# Patient Record
Sex: Female | Born: 1970 | Race: Black or African American | Marital: Single | Smoking: Current every day smoker
Health system: Southern US, Community
[De-identification: ages and names within clinical notes are randomized; demographics above are authoritative.]

## PROBLEM LIST (undated history)

## (undated) DIAGNOSIS — I10 Essential (primary) hypertension: Secondary | ICD-10-CM

## (undated) DIAGNOSIS — E785 Hyperlipidemia, unspecified: Secondary | ICD-10-CM

## (undated) DIAGNOSIS — E119 Type 2 diabetes mellitus without complications: Secondary | ICD-10-CM

## (undated) DIAGNOSIS — Z8619 Personal history of other infectious and parasitic diseases: Secondary | ICD-10-CM

## (undated) HISTORY — DX: Personal history of other infectious and parasitic diseases: Z86.19

## (undated) HISTORY — DX: Type 2 diabetes mellitus without complications: E11.9

## (undated) HISTORY — DX: Hyperlipidemia, unspecified: E78.5

## (undated) HISTORY — DX: Essential (primary) hypertension: I10

## (undated) HISTORY — PX: TUBAL LIGATION: SHX77

---

## 2017-03-22 ENCOUNTER — Ambulatory Visit: Payer: 59 | Attending: Nurse Practitioner | Admitting: Physical Therapy

## 2017-03-22 ENCOUNTER — Encounter: Payer: Self-pay | Admitting: Physical Therapy

## 2017-03-22 ENCOUNTER — Other Ambulatory Visit: Payer: Self-pay

## 2017-03-22 DIAGNOSIS — M79604 Pain in right leg: Secondary | ICD-10-CM | POA: Insufficient documentation

## 2017-03-22 DIAGNOSIS — R262 Difficulty in walking, not elsewhere classified: Secondary | ICD-10-CM | POA: Diagnosis present

## 2017-03-22 DIAGNOSIS — M25551 Pain in right hip: Secondary | ICD-10-CM | POA: Insufficient documentation

## 2017-03-22 NOTE — Therapy (Signed)
[place], [place], [address] Phone: [phone]   Fax:  [phone]  Physical Therapy Evaluation  Patient Details  Name: Lisa Maynard MRN: [id number] Date of Birth: [birth year] Referring Provider: Anselm Jungling, FNP   Encounter Date: 03/22/2017  PT End of Session - 03/22/17 1553    Visit Number  1    Number of Visits  13    Date for PT Re-Evaluation  05/06/17    Authorization Type  AETNA    PT Start Time  1546    PT Stop Time  1638    PT Time Calculation (min)  52 min    Activity Tolerance  Patient tolerated treatment well    Behavior During Therapy  [place] for tasks assessed/performed       History reviewed. No pertinent past medical history.  History reviewed. No pertinent surgical history.  There were no vitals filed for this visit.   Subjective Assessment - 03/22/17 1553    Subjective  No pain in back unless you touch in, constant pain in lateral thigh that travels to lateral knee on Rt. Pain began June 2018 and progressively worsened. Was in PT to try to strengthen hip but feels like it got worse. Denies help from meloxicam- has been taking it about 1 month. Was standing at elevator yesterday and Lt knee gave out.     How long can you sit comfortably?  5 min    How long can you stand comfortably?  30 min    How long can you walk comfortably?  standing still is worse    Patient Stated Goals  sleep, reduce pain    Currently in Pain?  Yes    Pain Score  6  10/10 at worst in last 48 hr    Pain Location  Hip    Pain Orientation  Right;Lateral    Pain Descriptors / Indicators  Stabbing;Aching stabbing is random    Pain Relieving Factors  TENS         [facility] PT Assessment - 03/22/17 0001      Assessment   Medical Diagnosis  Rt LBP with sciatica    Referring Provider  Anselm Jungling, FNP    Hand Dominance  Right    Next MD Visit  2/28    Prior Therapy  yes 2 weeks      Precautions   Precautions  None      Restrictions   Weight Bearing Restrictions  No      Balance Screen   Has the patient fallen in the past 6 months  No      Home Environment   Additional Comments  stairs at work      Prior Automotive engineer      Cognition   Overall Cognitive Status  Within Functional Limits for tasks assessed      Observation/Other Assessments   Focus on Therapeutic Outcomes (FOTO)   54% limited      Sensation   Additional Comments  N/T in bilat feet, intermittent, edema       Posture/Postural Control   Posture Comments  standing with trunk sidebend Lt      ROM / Strength   AROM / PROM / Strength  Strength      Strength   Overall Strength Comments  not appropriate to test at eval due to pelvic rotation      Palpation   Palpation comment  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      [facility] Adult PT Treatment/Exercise - 03/22/17 0001      Exercises   Exercises  Knee/Hip      Knee/Hip Exercises: Supine   Other Supine Knee/Hip Exercises  pillow squeeze    Other Supine Knee/Hip Exercises  posterior pelvic tilt- practiced in supine, seated and standing      Modalities   Modalities  Electrical Stimulation;Moist Heat      Moist Heat Therapy   Number Minutes Moist Heat  15 Minutes with TENS    Moist Heat Location  Hip      Electrical Stimulation   Electrical Stimulation Location  Rt hip    Electrical Stimulation Action  IFC     Electrical Stimulation Parameters  15 min with heat    Electrical Stimulation Goals  Pain      Manual Therapy   Manual Therapy  Muscle Energy Technique    Muscle Energy Technique  Rt hip flexors/Lt extensors, iso add, manual LTR             PT Education - 03/22/17 1655    Education provided  Yes    Education Details  anatomy of condition, POC, HEP, exercise form/rationale, FOTO    Person(s) Educated  Patient     Methods  Explanation;Demonstration;Tactile cues;Verbal cues    Comprehension  Verbalized understanding;Need further instruction;Returned demonstration;Verbal cues required;Tactile cues required          PT Long Term Goals - 03/22/17 1643      PT LONG TERM GOAL #1   Title  Pt will be able to ambulate throughout her day for work and ADLs hip pain <=2/10    Baseline  up to 10/10 at eval    Time  6    Period  Weeks    Status  New    Target Date  05/06/17      PT LONG TERM GOAL #2   Title  Pt will be able to sit on the floor and get up/down from the floor without increased hip pain    Baseline  severe at eval    Time  6    Period  Weeks    Status  New    Target Date  05/06/17      PT LONG TERM GOAL #3   Title  Pt will be able to sit for at least 30 min without increased hip pain    Baseline  has to move after about 5 min    Time  6    Period  Weeks    Status  New    Target Date  05/06/17      PT LONG TERM GOAL #4   Title  FOTO to 40% limited to indicate significant improvement in functional ability    Baseline  54% limitation at eval    Time  6    Period  Weeks    Status  New    Target Date  05/06/17             Plan - 03/22/17 1629    Clinical Impression Statement  Pt presents to PT with complaints of Rt hip and lateral thigh pain that began in June. 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 Notable Rt post pelvic rotation creating LLD (Rt shorter) and abnormal strain along biomechanical chain. MET  utilized today to correct alignment which pt reported decreased pain but could feel posterior soreness as expected. Pt will benefit from skilled PT in order to improve lumbopelvic alignment and stability to reach long term functional goals.     History and Personal Factors relevant to plan of care:  chronic back pain    Clinical Presentation  Stable    Clinical Decision Making  Low    Rehab Potential  Good    PT Frequency  2x / week    PT Duration  6 weeks    PT  Treatment/Interventions  ADLs/Self Care Home Management;Cryotherapy;Electrical Stimulation;Ultrasound;Traction;Moist Heat;Iontophoresis 82m/ml Dexamethasone;Gait training;Stair training;Functional mobility training;Therapeutic activities;Therapeutic exercise;Balance training;Patient/family education;Neuromuscular re-education;Manual techniques;Scar mobilization;Passive range of motion;Taping;Dry needling    PT Next Visit Plan  recheck pelvic alignment, manual to soft tissues, lumbopelvic stability, ultrasound to bursa PRN    PT Home Exercise Plan  hooklying ball squeeze, abdominal engagement, stand equally on feet    Consulted and Agree with Plan of Care  Patient       Patient will benefit from skilled therapeutic intervention in order to improve the following deficits and impairments:  Pain, Improper body mechanics, Increased muscle spasms, Decreased activity tolerance, Decreased strength, Difficulty walking  Visit Diagnosis: Pain in right hip - Plan: PT plan of care cert/re-cert  Pain in right leg - Plan: PT plan of care cert/re-cert  Difficulty in walking, not elsewhere classified - Plan: PT plan of care cert/re-cert     Problem List There are no active problems to display for this patient.  Keilynn Marano C. Cris Gibby PT, DPT 03/22/17 5:10 PM   [facility] Center128 [address] StreetGFoot of [place] [address]Phone: [phone]  Fax:  [phone] Name: SZlata AlcaideMRN: [id number] of Birth: 712-04-1970

## 2017-03-24 ENCOUNTER — Ambulatory Visit: Payer: 59 | Admitting: Physical Therapy

## 2017-03-24 ENCOUNTER — Encounter: Payer: Self-pay | Admitting: Physical Therapy

## 2017-03-24 DIAGNOSIS — M79604 Pain in right leg: Secondary | ICD-10-CM

## 2017-03-24 DIAGNOSIS — R262 Difficulty in walking, not elsewhere classified: Secondary | ICD-10-CM

## 2017-03-24 DIAGNOSIS — M25551 Pain in right hip: Secondary | ICD-10-CM

## 2017-03-24 NOTE — Therapy (Signed)
[place], [place], [address] Phone: [phone]   Fax:  [phone]  Physical Therapy Treatment  Patient Details  Name: Lisa Maynard MRN: [id number] Date of Birth: 09-10-1970 Referring Provider: Anselm Jungling, FNP   Encounter Date: 03/24/2017  PT End of Session - 03/24/17 1306    Visit Number  2    Number of Visits  13    Date for PT Re-Evaluation  05/06/17    Authorization Type  AETNA    PT Start Time  0104    PT Stop Time  0200    PT Time Calculation (min)  56 min       History reviewed. No pertinent past medical history.  History reviewed. No pertinent surgical history.  There were no vitals filed for this visit.  Subjective Assessment - 03/24/17 1305    Currently in Pain?  Yes    Pain Score  7     Pain Location  Hip    Pain Orientation  Right;Lateral    Pain Descriptors / Indicators  Constant                      [facility] Adult PT Treatment/Exercise - 03/24/17 0001      Knee/Hip Exercises: Supine   Other Supine Knee/Hip Exercises  pillow squeeze, ab set, ab set isometric     Other Supine Knee/Hip Exercises  posterior pelvic tilt- practiced in supine, seated and standing      Moist Heat Therapy   Number Minutes Moist Heat  15 Minutes    Moist Heat Location  Hip      Electrical Stimulation   Electrical Stimulation Location  Rt hip    Electrical Stimulation Action  IFC x 15 min    Electrical Stimulation Parameters  10m    Electrical Stimulation Goals  Pain      Manual Therapy   Manual therapy comments  IASTM lateral hip                   PT Long Term Goals - 03/22/17 1643      PT LONG TERM GOAL #1   Title  Pt will be able to ambulate throughout her day for work and ADLs hip pain <=2/10    Baseline  up to 10/10 at eval    Time  6    Period  Weeks    Status  New    Target Date  05/06/17      PT LONG TERM GOAL #2   Title  Pt will be able to sit on  the floor and get up/down from the floor without increased hip pain    Baseline  severe at eval    Time  6    Period  Weeks    Status  New    Target Date  05/06/17      PT LONG TERM GOAL #3   Title  Pt will be able to sit for at least 30 min without increased hip pain    Baseline  has to move after about 5 min    Time  6    Period  Weeks    Status  New    Target Date  05/06/17      PT LONG TERM GOAL #4   Title  FOTO to 40% limited to indicate significant improvement in functional ability    Baseline  54% limitation at eval    Time  6    Period  Weeks    Status  New    Target Date  05/06/17            Plan - 03/24/17 1311    Clinical Impression Statement  Pt reports she felt alot better after last treatment until she drove alot yesterday and started hurting last night. Today she rates her pain at 7/10 lateral right hip. She is doing MET and seems in better alignment today. Reviewed Lumbar stab and MET. Began soft tissue to lateral thigh and trial of ultrasound to greater trochanter. HMP and IFC repeated per pt request.     PT Next Visit Plan  recheck pelvic alignment, manual to soft tissues, lumbopelvic stability, ultrasound to bursa PRN    PT Home Exercise Plan  hooklying ball squeeze, abdominal engagement, stand equally on feet    Consulted and Agree with Plan of Care  Patient       Patient will benefit from skilled therapeutic intervention in order to improve the following deficits and impairments:  Pain, Improper body mechanics, Increased muscle spasms, Decreased activity tolerance, Decreased strength, Difficulty walking  Visit Diagnosis: Pain in right hip  Pain in right leg  Difficulty in walking, not elsewhere classified     Problem List There are no active problems to display for this patient.   Dorene Ar, [place] 03/24/2017, 1:55 PM  [place], [place], [address] Phone:  [phone]   Fax:  [phone]  Name: Lisa Maynard MRN: [id number] Date of Birth: 02/18/1971

## 2017-03-29 ENCOUNTER — Ambulatory Visit: Payer: 59 | Admitting: Physical Therapy

## 2017-03-29 ENCOUNTER — Encounter: Payer: Self-pay | Admitting: Physical Therapy

## 2017-03-29 DIAGNOSIS — M25551 Pain in right hip: Secondary | ICD-10-CM | POA: Diagnosis not present

## 2017-03-29 DIAGNOSIS — M79604 Pain in right leg: Secondary | ICD-10-CM

## 2017-03-29 DIAGNOSIS — R262 Difficulty in walking, not elsewhere classified: Secondary | ICD-10-CM

## 2017-03-29 NOTE — Therapy (Signed)
[place], [place], [address] Phone: [phone]   Fax:  [phone]  Physical Therapy Treatment  Patient Details  Name: Lisa Maynard MRN: [id number] Date of Birth: 01-27-1971 Referring Provider: Anselm Jungling, FNP   Encounter Date: 03/29/2017  PT End of Session - 03/29/17 1339    Visit Number  3    Number of Visits  13    Date for PT Re-Evaluation  05/06/17    Authorization Type  AETNA    PT Start Time  0130    PT Stop Time  0230    PT Time Calculation (min)  60 min       History reviewed. No pertinent past medical history.  History reviewed. No pertinent surgical history.  There were no vitals filed for this visit.  Subjective Assessment - 03/29/17 1347    Subjective  I found out I probably have a brain tumor. MRI scheduled for tomorrow to confirm.     Currently in Pain?  Yes    Pain Score  2  up to 10/10 with sitting or laying     Pain Location  Hip    Pain Orientation  Right;Lateral    Pain Descriptors / Indicators  Stabbing;Throbbing    Pain Frequency  Intermittent    Aggravating Factors   laying or sitting    Pain Relieving Factors  TENS, keep moving                       [facility] Adult PT Treatment/Exercise - 03/29/17 0001      Lumbar Exercises: Supine   Ab Set  5 reps    Pelvic Tilt  10 reps    Clam  10 reps red     Bent Knee Raise  20 reps with abdominal draw in      Knee/Hip Exercises: Stretches   Piriformis Stretch  3 reps;30 seconds IR/ER    Other Knee/Hip Stretches  butterfly stretch 30 sec       Knee/Hip Exercises: Aerobic   Nustep  L2 LE only x  5 minutes       Knee/Hip Exercises: Supine   Hip Adduction Isometric  10 reps      Moist Heat Therapy   Number Minutes Moist Heat  15 Minutes    Moist Heat Location  Hip      Electrical Stimulation   Electrical Stimulation Location  Rt hip    Electrical Stimulation Action  IFC x 15 min    Electrical  Stimulation Parameters  20 ma     Electrical Stimulation Goals  Pain                  PT Long Term Goals - 03/22/17 1643      PT LONG TERM GOAL #1   Title  Pt will be able to ambulate throughout her day for work and ADLs hip pain <=2/10    Baseline  up to 10/10 at eval    Time  6    Period  Weeks    Status  New    Target Date  05/06/17      PT LONG TERM GOAL #2   Title  Pt will be able to sit on the floor and get up/down from the floor without increased hip pain    Baseline  severe at eval    Time  6    Period  Weeks    Status  New  Target Date  05/06/17      PT LONG TERM GOAL #3   Title  Pt will be able to sit for at least 30 min without increased hip pain    Baseline  has to move after about 5 min    Time  6    Period  Weeks    Status  New    Target Date  05/06/17      PT LONG TERM GOAL #4   Title  FOTO to 40% limited to indicate significant improvement in functional ability    Baseline  54% limitation at eval    Time  6    Period  Weeks    Status  New    Target Date  05/06/17            Plan - 03/29/17 1343    Clinical Impression Statement  Pt saw MD on Friday and reports she was told she has a  possible brain tumor and will have MRI tomorrow. Pt reports feeling pain in her left lateral thigh with walking yesterday. She has never felt pain on the left before. SHe reports feeling better after last treatment however pain can still reach 7-10/10 with laying and sitting. Difficulty driving to [place] for work. Re-educated on pelvic tillt supine and sitting to use for driving comfort. Contninued lumbopelvic stability as tolerated. Piriformis stretch helpful. Decreased motor control with supine clam with red band. Updated HEP. Repeated IFC as pt was sore after therex. May need to discontinue IFC if malignancy confirmed.     PT Next Visit Plan  print HEP from last visit- assess tolerance.recheck pelvic alignment, manual to soft tissues, lumbopelvic  stability, ultrasound to bursa PRN (disontinue modalities?)    PT Home Exercise Plan  hooklying ball squeeze, abdominal engagement, stand equally on feet- (added red clam, pelvic tilt and piriformis stretch-forgot to give her paper copy)    Consulted and Agree with Plan of Care  Patient       Patient will benefit from skilled therapeutic intervention in order to improve the following deficits and impairments:  Pain, Improper body mechanics, Increased muscle spasms, Decreased activity tolerance, Decreased strength, Difficulty walking  Visit Diagnosis: Pain in right hip  Pain in right leg  Difficulty in walking, not elsewhere classified     Problem List There are no active problems to display for this patient.   Dorene Ar, [place] 03/29/2017, 3:11 PM  [place], [place], [address] Phone: [phone]   Fax:  [phone]  Name: Lisa Maynard MRN: [id number] Date of Birth: Jan 04, 1971

## 2017-03-29 NOTE — Patient Instructions (Addendum)
Pelvic Tilt: Anterior - Legs Bent (Supine)    Rotate pelvis up and arch back. Hold _5___ seconds. Relax. Repeat __10__ times per set. Do __2__ sets per session. Do ___2_ sessions per day.   External Rotation: Hip - Knees Apart (Hook-Lying)    Lie with hips and knees bent, band tied just above knees. Pull knees apart. Hold for 5___ seconds. Rest for _5__ seconds. Repeat _10-20__ times. Do __2_ times a day.  Piriformis Stretch - Supine    Pull uninvolved knee across body toward opposite shoulder. Hold slight stretch for _30__ seconds.  Repeat __3_ times. Do _2__ times per day. Can have opposite knee bent

## 2017-03-31 ENCOUNTER — Ambulatory Visit: Payer: 59 | Admitting: Physical Therapy

## 2017-04-05 ENCOUNTER — Encounter: Payer: Self-pay | Admitting: Physical Therapy

## 2017-04-05 ENCOUNTER — Ambulatory Visit: Payer: 59 | Attending: Nurse Practitioner | Admitting: Physical Therapy

## 2017-04-05 DIAGNOSIS — R262 Difficulty in walking, not elsewhere classified: Secondary | ICD-10-CM | POA: Insufficient documentation

## 2017-04-05 DIAGNOSIS — M79604 Pain in right leg: Secondary | ICD-10-CM | POA: Diagnosis present

## 2017-04-05 DIAGNOSIS — M25551 Pain in right hip: Secondary | ICD-10-CM | POA: Insufficient documentation

## 2017-04-05 NOTE — Therapy (Signed)
[facility] [address] [place], [place], [address] Phone: [phone]   Fax:  [phone]  Physical Therapy Treatment  Patient Details  Name: Lisa Maynard MRN: [id number] Date of Birth: 07/18/1970 Referring Provider: McMurray, Lura E, FNP   Encounter Date: 04/05/2017  PT End of Session - 04/05/17 1750    Visit Number  4    Number of Visits  13    Date for PT Re-Evaluation  05/06/17    PT Start Time  1503    PT Stop Time  1545    PT Time Calculation (min)  42 min    Activity Tolerance  Patient tolerated treatment well    Behavior During Therapy  WFL for tasks assessed/performed       History reviewed. No pertinent past medical history.  History reviewed. No pertinent surgical history.  There were no vitals filed for this visit.  Subjective Assessment - 04/05/17 1512    Subjective  Pain 8/10. in my back.  It just started.    Currently in Pain?  Yes    Pain Score  8     Pain Location  Back    Pain Orientation  Right;Lateral    Pain Descriptors / Indicators  Spasm;Stabbing;Throbbing    Pain Radiating Towards  glutes    Pain Frequency  Intermittent    Aggravating Factors   getting up frpom sitting    Pain Relieving Factors  not sure,  this pain is new    Multiple Pain Sites  No                      [facility] Adult PT Treatment/Exercise - 04/05/17 0001      Lumbar Exercises: Stretches   Single Knee to Chest Stretch  -- PROM  by PTA due to spasm in back,  also did PROM on side    Double Knee to Chest Stretch  -- AA legs on ball 10 X,      Pelvic Tilt  -- 10 X 1 breath and 1  X for 10 breaths    Standing Side Bend  5 reps standing and on side for QO,  pain decreased    Piriformis Stretch  -- able to tolerate on side,  bent knee to mat    Piriformis Stretch  3 reps;30 seconds IR/ER,  PROM too painful for patient to do      Lumbar Exercises: Supine   Bent Knee Raise  10 reps lifting legs from ball with  abdominals braced      Knee/Hip Exercises: Supine   Other Supine Knee/Hip Exercises  legfs on ball march  5 X,      Knee/Hip Exercises: Sidelying   Clams  5  X 2 sets shakey right only      Manual Therapy   Manual therapy comments  spasm reduced from 8/10 to 3/10  .  Manual to QL,  passive stretch to QL,  Passive hip  stretches and Instrument assist to IT band.              PT Education - 04/05/17 1750    Education provided  Yes    Education Details  HEP,  QL info    Person(s) Educated  Patient    Methods  Explanation;Demonstration;Tactile cues;Verbal cues;Handout    Comprehension  Returned demonstration;Verbalized understanding          PT Long Term Goals - 03/22/17 1643      PT LONG   TERM GOAL #1   Title  Pt will be able to ambulate throughout her day for work and ADLs hip pain <=2/10    Baseline  up to 10/10 at eval    Time  6    Period  Weeks    Status  New    Target Date  05/06/17      PT LONG TERM GOAL #2   Title  Pt will be able to sit on the floor and get up/down from the floor without increased hip pain    Baseline  severe at eval    Time  6    Period  Weeks    Status  New    Target Date  05/06/17      PT LONG TERM GOAL #3   Title  Pt will be able to sit for at least 30 min without increased hip pain    Baseline  has to move after about 5 min    Time  6    Period  Weeks    Status  New    Target Date  05/06/17      PT LONG TERM GOAL #4   Title  FOTO to 40% limited to indicate significant improvement in functional ability    Baseline  54% limitation at eval    Time  6    Period  Weeks    Status  New    Target Date  05/06/17            Plan - 04/05/17 1751    Clinical Impression Statement  Patient has no modalities today.  I did not offer.  Pain decreased to 3-2/10 post session.  Spasm in QL decreased with exercise and manual,  Exercises were difficult today especially prirformis,  it was tolerated PROM.   No new goals met.  patient is  going to see MD tomorrow to learn about the options for her Brain tumor.      PT Next Visit Plan  print HEP from last visit- assess tolerance.recheck pelvic alignment, manual to soft tissues, lumbopelvic stability, ultrasound to bursa PRN (disontinue modalities?) ( I did not do today)    PT Home Exercise Plan  hooklying ball squeeze, abdominal engagement, stand equally on feet- (added red clam, pelvic tilt and piriformis stretch-forgot to give her paper copy)  QL stretches    Consulted and Agree with Plan of Care  Patient       Patient will benefit from skilled therapeutic intervention in order to improve the following deficits and impairments:     Visit Diagnosis: Pain in right hip  Pain in right leg  Difficulty in walking, not elsewhere classified     Problem List There are no active problems to display for this patient.   HARRIS,KAREN PTA  04/05/2017, 5:56 PM  [facility] [address] [place], [place], [address] Phone: [phone]   Fax:  [phone]  Name: Lisa Maynard MRN: [id number] Date of Birth: 09/24/1970   

## 2017-04-05 NOTE — Patient Instructions (Signed)
Issued from ex drawer QL stretches PRN 3 to 5 X  30 seconds  Standing side bend and on side.  QL instructions  For scoot to side for stand.

## 2017-04-07 ENCOUNTER — Encounter: Payer: Self-pay | Admitting: Physical Therapy

## 2017-04-07 ENCOUNTER — Ambulatory Visit: Payer: 59 | Admitting: Physical Therapy

## 2017-04-07 DIAGNOSIS — M79604 Pain in right leg: Secondary | ICD-10-CM

## 2017-04-07 DIAGNOSIS — M25551 Pain in right hip: Secondary | ICD-10-CM | POA: Diagnosis not present

## 2017-04-07 DIAGNOSIS — R262 Difficulty in walking, not elsewhere classified: Secondary | ICD-10-CM

## 2017-04-07 NOTE — Therapy (Addendum)
[place], [place], [address] Phone: [phone]   Fax:  [phone]  Physical Therapy Treatment/Discharge Summary  Patient Details  Name: Lisa Maynard MRN: [id number] Date of Birth: 1970-03-14 Referring Provider: Anselm Jungling, FNP   Encounter Date: 04/07/2017  PT End of Session - 04/07/17 1415    Visit Number  5    Number of Visits  13    PT Start Time  1330    PT Stop Time  1415    PT Time Calculation (min)  45 min    Activity Tolerance  Patient tolerated treatment well    Behavior During Therapy  [facility] for tasks assessed/performed       History reviewed. No pertinent past medical history.  History reviewed. No pertinent surgical history.  There were no vitals filed for this visit.  Subjective Assessment - 04/07/17 1412    Currently in Pain?  Yes    Pain Score  4     Pain Location  Back    Pain Orientation  Right;Lateral    Pain Descriptors / Indicators  Aching                      [facility] Adult PT Treatment/Exercise - 04/07/17 0001      Lumbar Exercises: Stretches   Other Lumbar Stretch Exercise  QL stretches.      Knee/Hip Exercises: Sidelying   Other Sidelying Knee/Hip Exercises  hip depression 5 x for neutral spine.        Manual Therapy   Manual therapy comments  IASTM lateral hip  and glutes.  all tender    Kinesiotex  -- I,  * and X patterns greater trochanter low back and right h             PT Education - 04/07/17 1415    Education provided  No          PT Long Term Goals - 03/22/17 1643      PT LONG TERM GOAL #1   Title  Pt will be able to ambulate throughout her day for work and ADLs hip pain <=2/10    Baseline  up to 10/10 at eval    Time  6    Period  Weeks    Status  New    Target Date  05/06/17      PT LONG TERM GOAL #2   Title  Pt will be able to sit on the floor and get up/down from the floor without increased hip pain    Baseline   severe at eval    Time  6    Period  Weeks    Status  New    Target Date  05/06/17      PT LONG TERM GOAL #3   Title  Pt will be able to sit for at least 30 min without increased hip pain    Baseline  has to move after about 5 min    Time  6    Period  Weeks    Status  New    Target Date  05/06/17      PT LONG TERM GOAL #4   Title  FOTO to 40% limited to indicate significant improvement in functional ability    Baseline  54% limitation at eval    Time  6    Period  Weeks    Status  New    Target Date  05/06/17  Plan - 04/07/17 1416    Clinical Impression Statement  Asked patient to get permission for E- Stim in writing.  No pain at end of session.  trial of taping helpful.  Progress toward pain goal.     PT Next Visit Plan  print HEP from last visit- assess tolerance.recheck pelvic alignment, manual to soft tissues, lumbopelvic stability, ultrasound to bursa PRN (disontinue modalities?) ( I did not do today)    PT Home Exercise Plan  hooklying ball squeeze, abdominal engagement, stand equally on feet- (added red clam, pelvic tilt and piriformis stretch-forgot to give her paper copy)  QL stretches    Consulted and Agree with Plan of Care  Patient       Patient will benefit from skilled therapeutic intervention in order to improve the following deficits and impairments:     Visit Diagnosis: Pain in right hip  Pain in right leg  Difficulty in walking, not elsewhere classified     Problem List There are no active problems to display for this patient.   HARRIS,KAREN PTA 04/07/2017, 2:18 PM  [facility] [address] [place], [place], [address] Phone: [phone]   Fax:  [phone]  Name: Lisa Maynard MRN: [id number] Date of Birth: [birth year]  PHYSICAL THERAPY DISCHARGE SUMMARY  Visits from Start of Care: 5  Current functional level related to goals / functional outcomes: See above   Remaining  deficits: See above   Education / Equipment: Anatomy of condition, POC, HEP, exercise form/rationale  Plan: Patient agrees to discharge.  Patient goals were not met. Patient is being discharged due to a change in medical status.  ?????    Patient left message to cancel all appointments.    Jessica C. Hightower PT, DPT 05/24/17 9:35 AM

## 2017-04-07 NOTE — Patient Instructions (Signed)
Remove tape if irritating 

## 2017-04-12 ENCOUNTER — Ambulatory Visit: Payer: 59 | Admitting: Physical Therapy

## 2017-04-14 ENCOUNTER — Ambulatory Visit: Payer: 59 | Admitting: Physical Therapy

## 2017-06-06 ENCOUNTER — Other Ambulatory Visit: Payer: Self-pay | Admitting: Nurse Practitioner

## 2017-06-06 DIAGNOSIS — M5441 Lumbago with sciatica, right side: Principal | ICD-10-CM

## 2017-06-06 DIAGNOSIS — G8929 Other chronic pain: Secondary | ICD-10-CM

## 2017-06-10 ENCOUNTER — Ambulatory Visit
Admission: RE | Admit: 2017-06-10 | Discharge: 2017-06-10 | Disposition: A | Discharge status: Home / Self Care | Payer: 59 | Source: Ambulatory Visit | Attending: Nurse Practitioner | Admitting: Nurse Practitioner

## 2017-06-10 DIAGNOSIS — M5441 Lumbago with sciatica, right side: Principal | ICD-10-CM

## 2017-06-10 DIAGNOSIS — G8929 Other chronic pain: Secondary | ICD-10-CM

## 2017-09-19 ENCOUNTER — Other Ambulatory Visit: Payer: Self-pay | Admitting: Endocrinology

## 2017-09-19 DIAGNOSIS — E1165 Type 2 diabetes mellitus with hyperglycemia: Principal | ICD-10-CM

## 2017-09-19 DIAGNOSIS — E785 Hyperlipidemia, unspecified: Secondary | ICD-10-CM

## 2017-09-19 DIAGNOSIS — E221 Hyperprolactinemia: Secondary | ICD-10-CM

## 2017-09-19 DIAGNOSIS — IMO0002 Reserved for concepts with insufficient information to code with codable children: Secondary | ICD-10-CM

## 2017-09-19 DIAGNOSIS — E1140 Type 2 diabetes mellitus with diabetic neuropathy, unspecified: Secondary | ICD-10-CM

## 2017-09-19 DIAGNOSIS — D352 Benign neoplasm of pituitary gland: Secondary | ICD-10-CM

## 2017-09-22 ENCOUNTER — Ambulatory Visit
Admission: RE | Admit: 2017-09-22 | Discharge: 2017-09-22 | Disposition: A | Discharge status: Home / Self Care | Payer: 59 | Source: Ambulatory Visit | Attending: Endocrinology | Admitting: Endocrinology

## 2017-09-22 DIAGNOSIS — IMO0002 Reserved for concepts with insufficient information to code with codable children: Secondary | ICD-10-CM

## 2017-09-22 DIAGNOSIS — E221 Hyperprolactinemia: Secondary | ICD-10-CM

## 2017-09-22 DIAGNOSIS — E1165 Type 2 diabetes mellitus with hyperglycemia: Principal | ICD-10-CM

## 2017-09-22 DIAGNOSIS — D352 Benign neoplasm of pituitary gland: Secondary | ICD-10-CM

## 2017-09-22 DIAGNOSIS — E785 Hyperlipidemia, unspecified: Secondary | ICD-10-CM

## 2017-09-22 DIAGNOSIS — E1140 Type 2 diabetes mellitus with diabetic neuropathy, unspecified: Secondary | ICD-10-CM

## 2017-09-22 MED ORDER — GADOBENATE DIMEGLUMINE 529 MG/ML IV SOLN
10.0000 mL | Freq: Once | INTRAVENOUS | Status: AC | PRN
  Administered 2017-09-22: 10 mL via INTRAVENOUS

## 2017-12-14 ENCOUNTER — Encounter: Payer: Self-pay | Admitting: Endocrinology

## 2018-01-11 NOTE — Progress Notes (Signed)
Name: Lisa Maynard  MRN/ DOB: [id number], [birth year]   Age/ Sex: 47 y.o., female    PCP: Milta Deiters, MD   Reason for Endocrinology Evaluation: Type 2 Diabetes Mellitus     Date of Initial Endocrinology Visit: 01/12/2018     PATIENT IDENTIFIER: Lisa Maynard is a 47 y.o. female with a past medical history of GERD, Hyperlipidemia, HTN , prolactinoma, fatty liver (on CT 11/06/16)and T2DM . The patient presented for initial ndocrinology clinic visit on 01/12/2018 for consultative assistance with her diabetes management.    HPI: Ms. Alperin is here with her wife Bennie . She  was diagnosed with hyperprolactinemia by Dr. Dwyane Dee (Endo ) on 03/25/17. She was started on cabergoline but due to nausea the dose decreased to 0.25 mg once a week on 05/06/17. Pt continued with nausea at time and  continues with galactorrhea.   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  MRI done in 03/2017 and again in 08/2017 showed no changes in size.  She has stable chronic headaches but no vision changes .  She has uterine fibroids and polyps and is planning hysterectomy.    Diagnosed with DM in 11/2016 Prior Medications tried: Glipizide - hypoglycemia . Trulicity  Started 04/6832- lost 20 lbs Hypoglycemia episodes : no          Symptoms: shaky                 Frequency: > 6 months ago  Hemoglobin A1c has ranged from 6.7% in 04/2016, peaking at 12.7% in 08/2017 Currently checking blood sugars 1 x / day,  before breakfast.  Patient required assistance for hypoglycemia: No Patient has required hospitalization within the last 1 year from hyper or hypoglycemia: No  In terms of diet, the patient avoids sugar-sweetened beverages.    MGM with DM , PGM and paternal aunt with DM   HOME DIABETES REGIMEN: Metformin 1962 mg BID Trulicity 2.29 mg weekly   Statin: yes ACE-I/ARB: no Prior Diabetic Education: no    METER DOWNLOAD SUMMARY: Did not bring meter  14- day average 114 (memor recall  ) Fasting 1092 days ago    DIABETIC COMPLICATIONS: Microvascular complications:    Denies: retinopathy, nephropathy, neuropathy (she has one episode of tingling that she attributes to spondylosis)  Last eye exam: Completed 09/2017  Macrovascular complications:    Denies: CAD, PVD, CVA   PAST HISTORY:  Past Medical History: T2DM, Prolactinoma  Past Surgical History:  Past Surgical History:  Procedure Laterality Date  . TUBAL LIGATION        Social History:  reports that she has been smoking cigarettes. She has a 20.00 pack-year smoking history. She has never used smokeless tobacco. Her alcohol and drug histories are not on file. Family History:  Family History  Problem Relation Age of Onset  . Diabetes Paternal Grandmother   . Diabetes Paternal Grandfather      HOME MEDICATIONS: Allergies as of 01/12/2018   No Known Allergies     Medication List        Accurate as of 01/12/18  2:17 PM. Always use your most recent med list.          atorvastatin 10 MG tablet Commonly known as:  LIPITOR Take 10 mg by mouth daily.   atorvastatin 20 MG tablet Commonly known as:  LIPITOR Take 1 tablet by mouth at bedtime.   cabergoline 0.5 MG tablet Commonly known as:  DOSTINEX Take 0.5 tablets (0.25 mg  total) by mouth once a week.   cyclobenzaprine 10 MG tablet Commonly known as:  FLEXERIL TAKE 1 TABLET BY MOUTH THREE TIMES DAILY AS NEEDED FOR MUSCLE SPASM   Dulaglutide 0.75 MG/0.5ML Sopn Inject 0.75 mg into the skin once a week.   ferrous sulfate 325 (65 FE) MG EC tablet Take 325 mg by mouth 2 (two) times daily with a meal.   meloxicam 7.5 MG tablet Commonly known as:  MOBIC TAKE 1 TABLET BY MOUTH TWICE DAILY   metFORMIN 1000 MG tablet Commonly known as:  GLUCOPHAGE Take 1,000 mg by mouth 2 (two) times daily with a meal.   nicotine 14 mg/24hr patch Commonly known as:  NICODERM CQ - dosed in mg/24 hours Place onto the skin.   omeprazole 20 MG  capsule Commonly known as:  PRILOSEC Take 20 mg by mouth daily.   ondansetron 4 MG tablet Commonly known as:  ZOFRAN TAKE 1 TABLET BY MOUTH ONCE DAILY AS NEEDED FOR NAUSEA   tranexamic acid 650 MG Tabs tablet Commonly known as:  LYSTEDA Take 1 tablet by mouth 3 (three) times daily.        ALLERGIES: No Known Allergies   REVIEW OF SYSTEMS: A comprehensive ROS was conducted with the patient and is negative except as per HPI and below:  Review of Systems  Constitutional: Positive for weight loss. Negative for malaise/fatigue.  HENT: Negative for congestion and sore throat.   Eyes: Negative for blurred vision and pain.  Respiratory: Negative for cough and shortness of breath.   Cardiovascular: Negative for chest pain and palpitations.  Gastrointestinal: Positive for nausea. Negative for abdominal pain.  Genitourinary: Negative for flank pain and frequency.  Musculoskeletal: Positive for back pain.  Neurological: Negative for tingling and tremors.  Endo/Heme/Allergies: Negative for polydipsia.  Psychiatric/Behavioral: Negative for depression. The patient does not have insomnia.       OBJECTIVE:   VITAL SIGNS: BP 128/84   Pulse 79   Resp 14   Ht 5\' 7"  (1.702 m)   Wt 197 lb (89.4 kg)   SpO2 98%   BMI 30.85 kg/m    PHYSICAL EXAM:  General: Pt appears well and is in NAD  Hydration: Well-hydrated with moist mucous membranes and good skin turgor  HEENT: Head: Unremarkable with good dentition. Oropharynx clear without exudate.  Eyes: External eye exam normal without stare, lid lag or exophthalmos.  EOM intact.    Neck: General: Supple without adenopathy or carotid bruits. Thyroid: Thyroid R> L.  No goiter or nodules appreciated. No thyroid bruit.  Lungs: Clear with good BS bilat with no rales, rhonchi, or wheezes  Heart: RRR with normal S1 and S2 and no gallops; no murmurs; no rub  Abdomen: Normoactive bowel sounds, soft, nontender, without masses or organomegaly palpable   Extremities:  Lower extremities - No pretibial edema. No lesions.  Skin: Normal texture and temperature to palpation. No rash noted.   Neuro: MS is good with appropriate affect, pt is alert and Ox3    DM foot exam: 01/12/18 The skin of the feet is intact without sores or ulcerations. The pedal pulses are 2+ on right and 2+ on left. The sensation is intact to a screening 5.07, 10 gram monofilament bilaterally    DATA REVIEWED: 04/06/17  TG 95 mg/dL  LDL 75 mg/dL   09/07/17 A1c 12.7%  Prolactin 5.1 ng/mL    ASSESSMENT / PLAN / RECOMMENDATIONS:   1) Type 2 Diabetes Mellitus, optimally controlled, Without complications - Most recent  A1c of 6.3 %. Goal A1c < 7.0 %.   Plan: - Praised pt on lifestyle changes and weight loss. We discussed that the effects of carbohydrates on glucose control. Patient advised to avoid all sugar sweetened beverages, we discussed low carbohydrate diet, I also advised her to avoid snacks if possible, we also discussed options for low carb snacking. Her A1c has come down from 12.7% in July 2020 19 to 6.3% today. I will not be making any changes to her regimen at this point.   MEDICATIONS:  Continue Trulicity 4.98 mg weekly.  Continue metformin 1000 mg twice a day  EDUCATION / INSTRUCTIONS:  BG monitoring instructions: Patient is instructed to check her blood sugars 2 times a day, fasting and bedtime.  Call [facility] if: BG persistently < 70 or > 300. . I reviewed the Rule of 15 for the treatment of hypoglycemia in detail with the patient. Literature supplied.   2) Diabetic complications:   Eye: Does not have known diabetic retinopathy. Last eye exam was   Neuro/ Feet: Does not have known diabetic peripheral neuropathy.  Renal: Patient does not have known baseline CKD. She is knocked on an ACEI/ARB at present.   3) Lipids: Patient is on a statin.    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 Her last prolactin level was 5.1 ng/mL, and clear the cause of persistent galactorrhea, patient assured me there is no nipple stimulation. We discussed trying another option for hyperprolactinemia, such as bromocriptine, but patient declined at this time Reassurance provided to the patient that size of microadenoma is stable, will not need another repeat imaging for another 2-3 yrs, unless pt has new symptoms of worsening vision or headaches.    5) Tobacco abuse:  Over 66minutes were spent counseling the patient on the effects of tobacco and diabetes, increasing the risk of cardiovascular and peripheral vascular disease.  6) Fatty Liver : Praised patient on weight loss. Discussed that the main treatment for fatty liver is weight loss at this time.    F/u in 3 months   Signed electronically by: Mack Guise, MD  [facility]  [facility] [address] [place], [place] [address] Phone: [phone] FAX: [phone]   CC: Milta Deiters, [place] [address] Phone: [phone]  Fax: [phone]    Return to Endocrinology clinic as below: Future Appointments  Date Time Provider [place]  04/19/2018 10:10 AM , Melanie Crazier, MD [facility] None

## 2018-01-12 ENCOUNTER — Encounter: Payer: Self-pay | Admitting: Internal Medicine

## 2018-01-12 ENCOUNTER — Other Ambulatory Visit: Payer: Self-pay

## 2018-01-12 ENCOUNTER — Ambulatory Visit (INDEPENDENT_AMBULATORY_CARE_PROVIDER_SITE_OTHER): Payer: 59 | Admitting: Internal Medicine

## 2018-01-12 VITALS — BP 128/84 | HR 79 | Resp 14 | Ht 67.0 in | Wt 197.0 lb

## 2018-01-12 DIAGNOSIS — E1165 Type 2 diabetes mellitus with hyperglycemia: Secondary | ICD-10-CM | POA: Insufficient documentation

## 2018-01-12 DIAGNOSIS — E119 Type 2 diabetes mellitus without complications: Secondary | ICD-10-CM | POA: Diagnosis not present

## 2018-01-12 DIAGNOSIS — K760 Fatty (change of) liver, not elsewhere classified: Secondary | ICD-10-CM

## 2018-01-12 DIAGNOSIS — D352 Benign neoplasm of pituitary gland: Secondary | ICD-10-CM | POA: Diagnosis not present

## 2018-01-12 DIAGNOSIS — E785 Hyperlipidemia, unspecified: Secondary | ICD-10-CM

## 2018-01-12 LAB — POCT GLYCOSYLATED HEMOGLOBIN (HGB A1C): Hemoglobin A1C: 6.3 % — AB (ref 4.0–5.6)

## 2018-01-12 MED ORDER — CABERGOLINE 0.5 MG PO TABS: 0.2500 mg | ORAL_TABLET | ORAL | 3 refills | Status: DC

## 2018-01-12 MED ORDER — DULAGLUTIDE 0.75 MG/0.5ML ~~LOC~~ SOAJ: 0.7500 mg | SUBCUTANEOUS | 11 refills | Status: DC

## 2018-01-12 NOTE — Patient Instructions (Addendum)
-   check sugars fasting and at bedtime - Continue Metformin 1000 mg twice a day  - Continue Trulicity 3.30 mg once a week - Continue Cabergoline 0.25 mg once a week     Choose healthy, lower carb lower calorie snacks: toss salad, cooked vegetables, cottage cheese, peanut butter, low fat cheese / string cheese, lower sodium deli meat, tuna salad or chicken salad

## 2018-01-13 ENCOUNTER — Encounter: Payer: Self-pay | Admitting: Internal Medicine

## 2018-01-13 LAB — PROLACTIN: Prolactin: 6.5 ng/mL

## 2018-04-13 ENCOUNTER — Telehealth: Payer: Self-pay | Admitting: Internal Medicine

## 2018-04-13 ENCOUNTER — Other Ambulatory Visit: Payer: Self-pay

## 2018-04-13 MED ORDER — DULAGLUTIDE 0.75 MG/0.5ML ~~LOC~~ SOAJ: 0.7500 mg | SUBCUTANEOUS | 11 refills | Status: DC

## 2018-04-13 NOTE — Telephone Encounter (Signed)
MEDICATION: Dulaglutide (TRULICITY) 7.20 NO/7.0JG SOPN with refills  PHARMACY:  [facility] in [place] : Yes  IS PATIENT OUT OF MEDICATION: Yes  IF NOT; HOW MUCH IS LEFT: None  LAST APPOINTMENT DATE: @11 /14/2019  NEXT APPOINTMENT DATE:@2 /17/2020  DO WE HAVE YOUR PERMISSION TO LEAVE A DETAILED MESSAGE:Yes  OTHER COMMENTS: Last 2 injections splattered all over.   **Let patient know to contact pharmacy at the end of the day to make sure medication is ready. **  ** Please notify patient to allow 48-72 hours to process**  **Encourage patient to contact the pharmacy for refills or they can request refills through [facility]**

## 2018-04-13 NOTE — Telephone Encounter (Signed)
Rx sent 

## 2018-04-14 ENCOUNTER — Other Ambulatory Visit: Payer: Self-pay

## 2018-04-14 MED ORDER — DULAGLUTIDE 0.75 MG/0.5ML ~~LOC~~ SOAJ: 0.7500 mg | SUBCUTANEOUS | 11 refills | Status: DC

## 2018-04-14 NOTE — Telephone Encounter (Signed)
Patient called re: patient requested the RX listed below (Dulaglutide (TRULICITY) 1.85 BM/1.5AE SOPN) be sent to [facility] in [place]. Please resend RX to the requested Pharmacy listed herein.

## 2018-04-14 NOTE — Telephone Encounter (Signed)
Rx sent to pharmacy listed below. 

## 2018-04-17 ENCOUNTER — Ambulatory Visit: Payer: 59 | Admitting: Internal Medicine

## 2018-04-17 ENCOUNTER — Encounter: Payer: Self-pay | Admitting: Internal Medicine

## 2018-04-17 VITALS — BP 128/80 | HR 81 | Resp 16 | Ht 67.0 in | Wt 207.2 lb

## 2018-04-17 DIAGNOSIS — E119 Type 2 diabetes mellitus without complications: Secondary | ICD-10-CM | POA: Diagnosis not present

## 2018-04-17 DIAGNOSIS — E1165 Type 2 diabetes mellitus with hyperglycemia: Secondary | ICD-10-CM

## 2018-04-17 DIAGNOSIS — D352 Benign neoplasm of pituitary gland: Secondary | ICD-10-CM

## 2018-04-17 LAB — POCT GLYCOSYLATED HEMOGLOBIN (HGB A1C): Hemoglobin A1C: 7.7 % — AB (ref 4.0–5.6)

## 2018-04-17 NOTE — Progress Notes (Signed)
Name: Lisa Maynard  Age/ Sex: 48 y.o., female   MRN/ DOB: [id number], 1971-01-15     PCP: Milta Deiters, MD   Reason for Endocrinology Evaluation: Type 2 Diabetes Mellitus  Initial Endocrine Consultative Visit:  01/12/2018    PATIENT IDENTIFIER: Lisa Maynard is a 48 y.o. female with a past medical history of GERD, HTN, prolactinoma,  fatty liver(on CT from November 06, 2016) and type 2 diabetes. The patient has followed with Endocrinology clinic since 01/12/2018 for consultative assistance with management of her diabetes.  DIABETIC HISTORY:  Ms. Macho was diagnosed with T2DM in October 2018.  She has tried glipizide in the past but cause hypoglycemic episodes.  She has been on Trulicity since July 5284 and has lost 20 LBS on it, she has never been on insulin. Her hemoglobin A1c has ranged from  6.7% in 04/2016, peaking at 12.7% in 08/2017   PROLACTINOMA HISTORY: She was diagnosed with hyperprolactinemia by Dr. Dwyane Dee (Endo ) on 03/25/17. She was started on cabergoline but due to nausea the dose decreased to 0.25 mg once a week on 05/06/17. 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  MRI done in 03/2017 and again in 08/2017 showed no changes in size.  She has stable chronic headaches but no vision changes .   SUBJECTIVE:   During the last visit (01/12/2018): Her A1c was 6.3%, we continued her metformin and Trulicity.  We continued to be willing at 0.25 weeklyDue to prolactin level of 6.5 ng/mL.  Today (04/18/2018): [name] is here for 29-month follow-up on diabetes and prolactinoma.  She checks her blood sugars few times a week . The patient has not had hypoglycemic episodes since the last clinic visit.  Patient admits to dietary indiscretion in the past few months, as she has been taking care of her best friend who was recently diagnosed with stage IV breast cancer.  Otherwise, the patient has not  required any recent emergency interventions for hypoglycemia and has not had recent hospitalizations secondary to hyper or hypoglycemic episodes.   She continues with intermittent nausea and vomiting, she is not sure if this was related to cabergoline or uncontrolled GERD, she does not believe this is related to Trulicity or her symptoms where they are prior to starting.   She has stable headaches she denies any recent change in vision, galactorrhea has resolved and less with stimulation.  ROS: As per HPI and as detailed below: Review of Systems  Constitutional: Negative for fever and weight loss.  HENT: Negative for congestion and sore throat.   Respiratory: Negative for cough and shortness of breath.   Cardiovascular: Negative for chest pain and palpitations.  Gastrointestinal: Positive for nausea and vomiting. Negative for diarrhea.      HOME DIABETES REGIMEN:  Metformin 1324 mg BID Trulicity 4.01 mg weekly  Cabergoline 0.25 mg weekly   Statin: yes  ACE-I/ARB: no  METER DOWNLOAD SUMMARY: Did not bring    DIABETIC COMPLICATIONS: Microvascular complications:   Denies: retinopathy, nephropathy, neuropathy (she has one episode of tingling that she attributes to spondylosis)  Last eye exam: Completed 09/2017   Macrovascular complications:   Denies: CAD, CVA, PVD   HISTORY:  Past Medical History:  Past Medical History:  Diagnosis Date  . Diabetes mellitus without complication ([place])   . History of chickenpox   . Hyperlipidemia   . Hypertension    Past Surgical History:  Past Surgical History:  Procedure Laterality Date  . TUBAL LIGATION  Social History:  reports that she has been smoking cigarettes. She has a 20.00 pack-year smoking history. She has never used smokeless tobacco. No history on file for alcohol and drug. Family History:  Family History  Problem Relation Age of Onset  . Diabetes Paternal Grandmother   . Diabetes Paternal Grandfather       HOME MEDICATIONS: Allergies as of 04/17/2018   No Known Allergies     Medication List       Accurate as of April 17, 2018 11:59 PM. Always use your most recent med list.        atorvastatin 10 MG tablet Commonly known as:  LIPITOR Take 10 mg by mouth daily.   baclofen 10 MG tablet Commonly known as:  LIORESAL Take 10 mg by mouth 3 (three) times daily.   cabergoline 0.5 MG tablet Commonly known as:  DOSTINEX Take 0.5 tablets (0.25 mg total) by mouth once a week.   Dulaglutide 0.75 MG/0.5ML Sopn Commonly known as:  TRULICITY Inject 2.69 mg into the skin once a week.   ferrous sulfate 325 (65 FE) MG EC tablet Take 325 mg by mouth 2 (two) times daily with a meal.   gabapentin 300 MG capsule Commonly known as:  NEURONTIN 300 mg 4 (four) times daily.   meloxicam 7.5 MG tablet Commonly known as:  MOBIC TAKE 1 TABLET BY MOUTH TWICE DAILY   metFORMIN 1000 MG tablet Commonly known as:  GLUCOPHAGE Take 1,000 mg by mouth 2 (two) times daily with a meal.   nicotine 14 mg/24hr patch Commonly known as:  NICODERM CQ - dosed in mg/24 hours Place onto the skin.   omeprazole 20 MG capsule Commonly known as:  PRILOSEC Take 20 mg by mouth daily.   ondansetron 4 MG tablet Commonly known as:  ZOFRAN TAKE 1 TABLET BY MOUTH ONCE DAILY AS NEEDED FOR NAUSEA   tranexamic acid 650 MG Tabs tablet Commonly known as:  LYSTEDA Take 1 tablet by mouth 3 (three) times daily.        OBJECTIVE:   Vital Signs: BP 128/80 (BP Location: Left Arm, Patient Position: Sitting, Cuff Size: Normal)   Pulse 81   Resp 16   Ht 5\' 7"  (1.702 m)   Wt 207 lb 3.2 oz (94 kg)   LMP 04/01/2018   SpO2 99%   BMI 32.45 kg/m   Wt Readings from Last 3 Encounters:  04/17/18 207 lb 3.2 oz (94 kg)  01/12/18 197 lb (89.4 kg)     Exam: General: Pt appears well and is in NAD  Lungs: Clear with good BS bilat with no rales, rhonchi, or wheezes  Heart: RRR with normal S1 and S2 and no gallops; no  murmurs; no rub  Abdomen: Normoactive bowel sounds, soft, nontender, without masses or organomegaly palpable  Extremities: No pretibial edema. No tremor. Normal strength and motion throughout.   Skin: Normal texture and temperature to palpation. No rash noted.  Neuro: MS is good with appropriate affect, pt is alert and Ox3       DM foot exam: 01/12/18 The skin of the feet is intact without sores or ulcerations. The pedal pulses are 2+ on right and 2+ on left. The sensation is intact to a screening 5.07, 10 gram monofilament bilaterally         DATA REVIEWED:  Lab Results  Component Value Date   HGBA1C 7.7 (A) 04/17/2018   HGBA1C 6.3 (A) 01/12/2018  04/06/17  TG 95 mg/dL  LDL 75 mg/dL  09/07/17 A1c 12.7%  Prolactin 5.1 ng/mL  Results for KIYARA, BOUFFARD (MRN [id number]) as of 04/19/2018 12:26  Ref. Range 01/12/2018 10:57 04/17/2018 10:32 04/17/2018 10:54  Prolactin Latest Units: ng/mL 6.5  7.9    ASSESSMENT / PLAN / RECOMMENDATIONS:   1) Type 2 diabetes Mellitus, sub-optimally controlled, With out complications - Most recent A1c of 7.7 %. Goal A1c <7.0 %.  Up from 6.3%  Plan:  -Patient states compliance with her metformin and Trulicity, patient admits to continued dietary indiscretion, as she has been busy taking care of her best friend who was recently diagnosed with stage IV breast cancer. Patient is motivated to go back to lifestyle changes, I will not be increasing her Trulicity due to the intermittent nausea and vomiting, she does not believe this is related to Trulicity because her symptoms started prior to being on Trulicity, she attributed attributes her nausea and vomiting to either cabergoline or uncontrolled acid reflux. -She has declined any add-on medication to her diabetes regimen at this point.   MEDICATIONS:  Continue Trulicity 9.41 mg weekly.  Continue metformin 1000 mg twice a day   EDUCATION / INSTRUCTIONS:  BG monitoring instructions: Patient is  instructed to check her blood sugars 2 times a day, fasting and bedtime.  Call [facility] if: BG persistently < 70 or > 300. . I reviewed the Rule of 15 for the treatment of hypoglycemia in detail with the patient. Literature supplied.   2) Diabetic complications:   Eye: Does not have known diabetic retinopathy.   Neuro/ Feet: Does not have known diabetic peripheral neuropathy .   Renal: Patient does not have known baseline CKD. She is not on an ACEI/ARB at present.  I would recommend annual urine microalbumin/creatinine ratio, if this is abnormal patient may be started on ACE inhibitor or an ARB, will defer this to PCP.   3) Lipids: Patient is on a statin.   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 -Galactorrhea has resolved unless there is nipple stimulation. - Reassurance provided to the patient that size of microadenoma is stable, will not need another repeat imaging for another 2-3 yrs, unless pt has new symptoms of worsening vision or headaches.  -Prolactin levels are within normal limits as above, I question compliance though as there is a trend of gradual increase in prolactin level.  We will continue to monitor   F/U in 3 months   Signed electronically by: Mack Guise, MD  [facility]  [address] [facility] [address] [place], [place] [address] Phone: [phone] FAX: [phone]   CC: Milta Deiters, [place] [address] Phone: [phone]  Fax: [phone]  Return to Endocrinology clinic as below: Future Appointments  Date Time Provider [place]  07/17/2018 10:30 AM Kamryn Messineo, Melanie Crazier, MD [facility] None

## 2018-04-17 NOTE — Patient Instructions (Signed)
-   check sugars fasting and at bedtime - Continue Metformin 1000 mg twice a day  - Continue Trulicity 7.40 mg once a week - Continue Cabergoline 0.25 mg once a week     Choose healthy, lower carb lower calorie snacks: toss salad, cooked vegetables, cottage cheese, peanut butter, low fat cheese / string cheese, lower sodium deli meat, tuna salad or chicken salad

## 2018-04-18 ENCOUNTER — Encounter: Payer: Self-pay | Admitting: Internal Medicine

## 2018-04-18 LAB — PROLACTIN: Prolactin: 7.9 ng/mL

## 2018-04-19 ENCOUNTER — Encounter: Payer: Self-pay | Admitting: Internal Medicine

## 2018-04-19 ENCOUNTER — Ambulatory Visit: Payer: 59 | Admitting: Internal Medicine

## 2018-07-17 ENCOUNTER — Ambulatory Visit: Payer: 59 | Admitting: Internal Medicine

## 2018-07-17 NOTE — Progress Notes (Deleted)
Name: Lisa Maynard  Age/ Sex: 48 y.o., female   MRN/ DOB: [id number], Jan 14, 1971     PCP: Milta Deiters, MD   Reason for Endocrinology Evaluation: Type 2 Diabetes Mellitus  Initial Endocrine Consultative Visit:  01/12/2018    PATIENT IDENTIFIER: Lisa Maynard is a 48 y.o. female with a past medical history of GERD, HTN, prolactinoma,  fatty liver(on CT from November 06, 2016) and type 2 diabetes. The patient has followed with Endocrinology clinic since 01/12/2018 for consultative assistance with management of her diabetes.  DIABETIC HISTORY:  Lisa Maynard was diagnosed with T2DM in October 2018.  She has tried glipizide in the past but caused hypoglycemic episodes.  She has been on Trulicity since July 0932 and has lost 20 LBS on it, she has never been on insulin. Her hemoglobin A1c has ranged from 6.7% in 04/2016, peaking at 12.7% in 08/2017   PROLACTINOMA HISTORY: She was diagnosed with hyperprolactinemia by Dr. Dwyane Dee (Endo ) on 03/25/17. She was started on cabergoline but due to nausea the dose decreased to 0.25 mg once a week on 05/06/17. 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  MRI done in 03/2017 and again in 08/2017 showed no changes in size.  She has stable chronic headaches but no vision changes .   SUBJECTIVE:   During the last visit (04/17/2018): Her A1c was 7.7%, we continued her metformin and Trulicity.  We continued prolactin at 0.25 weekly.   Today (07/17/2018): Lisa Maynard is here for 46-month follow-up on diabetes and prolactinoma.  She checks her blood sugars few times a week . The patient has not had hypoglycemic episodes since the last clinic visit.  Patient admits to dietary indiscretion in the past few months, as she has been taking care of her best friend who was recently diagnosed with stage IV breast cancer.  Otherwise, the patient has not required any recent emergency  interventions for hypoglycemia and has not had recent hospitalizations secondary to hyper or hypoglycemic episodes.   She continues with intermittent nausea and vomiting, she is not sure if this was related to cabergoline or uncontrolled GERD, she does not believe this is related to Trulicity or her symptoms where they are prior to starting.   She has stable headaches she denies any recent change in vision, galactorrhea has resolved and less with stimulation.  ROS: As per HPI and as detailed below: Review of Systems  Constitutional: Negative for fever and weight loss.  HENT: Negative for congestion and sore throat.   Respiratory: Negative for cough and shortness of breath.   Cardiovascular: Negative for chest pain and palpitations.  Gastrointestinal: Positive for nausea and vomiting. Negative for diarrhea.      HOME DIABETES REGIMEN:  Metformin 6712 mg BID Trulicity 4.58 mg weekly  Cabergoline 0.25 mg weekly   Statin: yes  ACE-I/ARB: no  METER DOWNLOAD SUMMARY: Did not bring    DIABETIC COMPLICATIONS: Microvascular complications:   Denies: retinopathy, nephropathy, neuropathy (she has one episode of tingling that she attributes to spondylosis)  Last eye exam: Completed 09/2017   Macrovascular complications:   Denies: CAD, CVA, PVD   HISTORY:  Past Medical History:  Past Medical History:  Diagnosis Date  . Diabetes mellitus without complication ([place])   . History of chickenpox   . Hyperlipidemia   . Hypertension    Past Surgical History:  Past Surgical History:  Procedure Laterality Date  . TUBAL LIGATION      Social History:  reports that she has been smoking cigarettes. She has a 20.00 pack-year smoking history. She has never used smokeless tobacco. No history on file for alcohol and drug. Family History:  Family History  Problem Relation Age of Onset  . Diabetes Paternal Grandmother   . Diabetes Paternal Grandfather      HOME MEDICATIONS: Allergies  as of 07/17/2018   No Known Allergies     Medication List       Accurate as of Jul 17, 2018  7:29 AM. If you have any questions, ask your nurse or doctor.        atorvastatin 10 MG tablet Commonly known as:  LIPITOR Take 10 mg by mouth daily.   cabergoline 0.5 MG tablet Commonly known as:  DOSTINEX Take 0.5 tablets (0.25 mg total) by mouth once a week.   Dulaglutide 0.75 MG/0.5ML Sopn Commonly known as:  Trulicity Inject 7.93 mg into the skin once a week.   ferrous sulfate 325 (65 FE) MG EC tablet Take 325 mg by mouth 2 (two) times daily with a meal.   gabapentin 300 MG capsule Commonly known as:  NEURONTIN 300 mg 4 (four) times daily.   meloxicam 7.5 MG tablet Commonly known as:  MOBIC TAKE 1 TABLET BY MOUTH TWICE DAILY   metFORMIN 1000 MG tablet Commonly known as:  GLUCOPHAGE Take 1,000 mg by mouth 2 (two) times daily with a meal.   nicotine 14 mg/24hr patch Commonly known as:  NICODERM CQ - dosed in mg/24 hours Place onto the skin.   omeprazole 20 MG capsule Commonly known as:  PRILOSEC Take 20 mg by mouth daily.   ondansetron 4 MG tablet Commonly known as:  ZOFRAN TAKE 1 TABLET BY MOUTH ONCE DAILY AS NEEDED FOR NAUSEA   tranexamic acid 650 MG Tabs tablet Commonly known as:  LYSTEDA Take 1 tablet by mouth 3 (three) times daily.        OBJECTIVE:   Vital Signs: There were no vitals taken for this visit.  Wt Readings from Last 3 Encounters:  04/17/18 207 lb 3.2 oz (94 kg)  01/12/18 197 lb (89.4 kg)     Exam: General: Pt appears well and is in NAD  Lungs: Clear with good BS bilat with no rales, rhonchi, or wheezes  Heart: RRR with normal S1 and S2 and no gallops; no murmurs; no rub  Abdomen: Normoactive bowel sounds, soft, nontender, without masses or organomegaly palpable  Extremities: No pretibial edema. No tremor. Normal strength and motion throughout.   Skin: Normal texture and temperature to palpation. No rash noted.  Neuro: MS is good  with appropriate affect, pt is alert and Ox3       DM foot exam: 01/12/18 The skin of the feet is intact without sores or ulcerations. The pedal pulses are 2+ on right and 2+ on left. The sensation is intact to a screening 5.07, 10 gram monofilament bilaterally         DATA REVIEWED:  Lab Results  Component Value Date   HGBA1C 7.7 (A) 04/17/2018   HGBA1C 6.3 (A) 01/12/2018  04/06/17  TG 95 mg/dL  LDL 75 mg/dL   09/07/17 A1c 12.7%  Prolactin 5.1 ng/mL  Results for LADASHA, SCHNACKENBERG (MRN [id number]) as of 04/19/2018 12:26  Ref. Range 01/12/2018 10:57 04/17/2018 10:32 04/17/2018 10:54  Prolactin Latest Units: ng/mL 6.5  7.9    ASSESSMENT / PLAN / RECOMMENDATIONS:   1) Type 2 diabetes Mellitus, sub-optimally controlled, With out complications - Most recent A1c  of 7.7 %. Goal A1c <7.0 %.  Up from 6.3%  Plan:  -Patient states compliance with her metformin and Trulicity, patient admits to continued dietary indiscretion, as she has been busy taking care of her best friend who was recently diagnosed with stage IV breast cancer. Patient is motivated to go back to lifestyle changes, I will not be increasing her Trulicity due to the intermittent nausea and vomiting, she does not believe this is related to Trulicity because her symptoms started prior to being on Trulicity, she attributed attributes her nausea and vomiting to either cabergoline or uncontrolled acid reflux. -She has declined any add-on medication to her diabetes regimen at this point.   MEDICATIONS:  Continue Trulicity 8.18 mg weekly.  Continue metformin 1000 mg twice a day   EDUCATION / INSTRUCTIONS:  BG monitoring instructions: Patient is instructed to check her blood sugars 2 times a day, fasting and bedtime.  Call [facility] if: BG persistently < 70 or > 300. . I reviewed the Rule of 15 for the treatment of hypoglycemia in detail with the patient. Literature supplied.   2) Diabetic  complications:   Eye: Does not have known diabetic retinopathy.   Neuro/ Feet: Does not have known diabetic peripheral neuropathy .   Renal: Patient does not have known baseline CKD. She is not on an ACEI/ARB at present.  I would recommend annual urine microalbumin/creatinine ratio, if this is abnormal patient may be started on ACE inhibitor or an ARB, will defer this to PCP.   3) Lipids: Patient is on a statin.   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 -Galactorrhea has resolved unless there is nipple stimulation. - Reassurance provided to the patient that size of microadenoma is stable, will not need another repeat imaging for another 2-3 yrs, unless pt has new symptoms of worsening vision or headaches.  -Prolactin levels are within normal limits as above, I question compliance though as there is a trend of gradual increase in prolactin level.  We will continue to monitor   F/U in 3 months   Signed electronically by: Mack Guise, MD  [facility]  [facility] [address] [place], [place] [address] Phone: [phone] FAX: [phone]   CC: Milta Deiters, [place] [address] Phone: [phone]  Fax: [phone]  Return to Endocrinology clinic as below: Future Appointments  Date Time Provider [place]  07/17/2018 10:30 AM Roark Rufo, Melanie Crazier, MD [facility] None

## 2018-07-21 ENCOUNTER — Ambulatory Visit: Payer: 59 | Admitting: Internal Medicine

## 2018-09-07 ENCOUNTER — Telehealth: Payer: Self-pay | Admitting: Internal Medicine

## 2018-09-07 ENCOUNTER — Other Ambulatory Visit: Payer: Self-pay

## 2018-09-07 MED ORDER — METFORMIN HCL 1000 MG PO TABS: 1000.0000 mg | ORAL_TABLET | Freq: Two times a day (BID) | ORAL | 0 refills | Status: DC

## 2018-09-07 NOTE — Telephone Encounter (Signed)
MEDICATION: Metformin  PHARMACY:  [facility] in [place] : patient is requested  IS PATIENT OUT OF MEDICATION: yes  IF NOT; HOW MUCH IS LEFT: none  LAST APPOINTMENT DATE: @2 /17/2020  NEXT APPOINTMENT DATE:@Visit  date not found  DO WE HAVE YOUR PERMISSION TO LEAVE A DETAILED MESSAGE: yes  OTHER COMMENTS:    **Let patient know to contact pharmacy at the end of the day to make sure medication is ready. **  ** Please notify patient to allow 48-72 hours to process**  **Encourage patient to contact the pharmacy for refills or they can request refills through [facility]**

## 2018-09-18 ENCOUNTER — Telehealth: Payer: Self-pay | Admitting: Internal Medicine

## 2018-09-18 NOTE — Telephone Encounter (Signed)
Patient states that a Prior Auth is needed for her Dulaglutide (TRULICITY) 9.43 EX/6.1YJ SOPN. She is currently out of this RX but states calling this number will get her approved.  [employer] # [phone]

## 2018-09-19 NOTE — Telephone Encounter (Signed)
Spoke to [employer] and Trulicity approved for 12 months spoke to pt and informed her to contact the pharmacy. [employer] rep stated that we would receive a fax with approval.

## 2018-09-22 ENCOUNTER — Other Ambulatory Visit: Payer: Self-pay

## 2018-09-26 ENCOUNTER — Encounter: Payer: Self-pay | Admitting: Internal Medicine

## 2018-09-26 ENCOUNTER — Ambulatory Visit: Payer: Managed Care, Other (non HMO) | Admitting: Internal Medicine

## 2018-09-26 VITALS — BP 118/78 | HR 79 | Temp 98.6°F | Ht 67.0 in | Wt 202.4 lb

## 2018-09-26 DIAGNOSIS — E119 Type 2 diabetes mellitus without complications: Secondary | ICD-10-CM

## 2018-09-26 DIAGNOSIS — D352 Benign neoplasm of pituitary gland: Secondary | ICD-10-CM

## 2018-09-26 LAB — POCT GLYCOSYLATED HEMOGLOBIN (HGB A1C): Hemoglobin A1C: 6.8 % — AB (ref 4.0–5.6)

## 2018-09-26 LAB — BASIC METABOLIC PANEL
BUN: 8 mg/dL (ref 6–23)
CO2: 23 mEq/L (ref 19–32)
Calcium: 9.3 mg/dL (ref 8.4–10.5)
Chloride: 103 mEq/L (ref 96–112)
Creatinine, Ser: 0.63 mg/dL (ref 0.40–1.20)
GFR: 100.42 mL/min (ref >60.00)
Glucose, Bld: 181 mg/dL — ABNORMAL HIGH (ref 70–99)
Potassium: 3.8 mEq/L (ref 3.5–5.1)
Sodium: 133 mEq/L — ABNORMAL LOW (ref 135–145)

## 2018-09-26 LAB — LIPID PANEL
Cholesterol: 128 mg/dL (ref 0–200)
HDL: 41.4 mg/dL (ref >39.00)
LDL Cholesterol: 67 mg/dL (ref 0–99)
NonHDL: 86.62
Total CHOL/HDL Ratio: 3
Triglycerides: 97 mg/dL (ref 0.0–149.0)
VLDL: 19.4 mg/dL (ref 0.0–40.0)

## 2018-09-26 LAB — MICROALBUMIN / CREATININE URINE RATIO
Creatinine,U: 159.2 mg/dL
Microalb Creat Ratio: 0.4 mg/g (ref 0.0–30.0)
Microalb, Ur: <0.7 mg/dL (ref 0.0–1.9)

## 2018-09-26 LAB — TSH: TSH: 1.42 u[IU]/mL (ref 0.35–4.50)

## 2018-09-26 LAB — T4, FREE: Free T4: 0.69 ng/dL (ref 0.60–1.60)

## 2018-09-26 NOTE — Patient Instructions (Signed)
-   Continue Metformin 1000 mg twice a day  - Continue Trulicity 9.15 mg once a week - Continue Cabergoline 0.25 mg once a week     Choose healthy, lower carb lower calorie snacks: toss salad, cooked vegetables, cottage cheese, peanut butter, low fat cheese / string cheese, lower sodium deli meat, tuna salad or chicken salad
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
## 2018-09-27 LAB — PROLACTIN: Prolactin: 9 ng/mL

## 2018-10-04 ENCOUNTER — Telehealth: Payer: Self-pay | Admitting: Internal Medicine

## 2018-10-04 NOTE — Telephone Encounter (Signed)
Spoke to pt and she stated that someone from our office told her that she was scheduled with [facility] who does not exist. Asked pt who she spoke to she just said one of the ladies in the front. Explained to pt that her Ct has been ordered but not scheduled yet because it has to be approved by her insurance company and then scheduled. Pt stated that she will contact her insurance company.

## 2018-10-04 NOTE — Telephone Encounter (Signed)
Spoke to pt, explained that there is a process for this type of imaging in which the insurance company must ok or make sure this is covered before it can be scheduled. Spoke to Lisa Maynard and she stated that she would work on this referral today.

## 2018-10-04 NOTE — Telephone Encounter (Signed)
Patient ph# [phone] called re: patient was told this morning that her MRI was scheduled with [facility]. Patient requests MRI be scheduled with [facility]. Patient requests to be called at the ph# listed above to discuss.

## 2018-10-04 NOTE — Telephone Encounter (Signed)
Patient called back to advise that she has "spoken with someone at her insurance company and that they "have received" the fax for approval on her brain MRI"  She wants to know where it is, the number it was sent to,etc.  Please advise [phone]

## 2018-10-09 ENCOUNTER — Other Ambulatory Visit: Payer: Self-pay

## 2018-10-09 ENCOUNTER — Telehealth: Payer: Self-pay | Admitting: Internal Medicine

## 2018-10-09 MED ORDER — METFORMIN HCL 1000 MG PO TABS: 1000.0000 mg | ORAL_TABLET | Freq: Two times a day (BID) | ORAL | 0 refills | Status: DC

## 2018-10-09 NOTE — Telephone Encounter (Signed)
Patient is requesting a 90 day supply.  Please Advise, Thanks

## 2018-10-09 NOTE — Telephone Encounter (Signed)
sent 

## 2018-10-18 ENCOUNTER — Telehealth: Payer: Self-pay

## 2018-10-18 NOTE — Telephone Encounter (Signed)
MRI approval valid from 10/17/2018 through 04/15/2019 authorization # [id number]

## 2018-11-08 ENCOUNTER — Ambulatory Visit
Admission: RE | Admit: 2018-11-08 | Discharge: 2018-11-08 | Disposition: A | Discharge status: Home / Self Care | Payer: 59 | Source: Ambulatory Visit | Attending: Internal Medicine | Admitting: Internal Medicine

## 2018-11-08 ENCOUNTER — Other Ambulatory Visit: Payer: Self-pay

## 2018-11-08 DIAGNOSIS — D352 Benign neoplasm of pituitary gland: Secondary | ICD-10-CM

## 2018-11-08 MED ORDER — GADOBENATE DIMEGLUMINE 529 MG/ML IV SOLN
10.0000 mL | Freq: Once | INTRAVENOUS | Status: AC | PRN
  Administered 2018-11-08: 11:00:00 10 mL via INTRAVENOUS

## 2018-11-15 ENCOUNTER — Telehealth: Payer: Self-pay | Admitting: Internal Medicine

## 2018-11-15 NOTE — Telephone Encounter (Signed)
Patient is calling in regards to her MRI results.  Please Advise, Thanks

## 2018-11-15 NOTE — Telephone Encounter (Signed)
Please advise 

## 2018-11-16 NOTE — Telephone Encounter (Signed)
Discussed MRI results with stable pituitary adenoma 3-4 mm, there's also a mention of minimal cavernous sinus invasion, discussed this case with radiology (Dr. Girtha Rm) who looked at prior images and states this has been stable from past readings its just clearer on recent images.    Pt expressed understanding of this

## 2018-12-05 ENCOUNTER — Other Ambulatory Visit: Payer: Self-pay

## 2018-12-05 ENCOUNTER — Telehealth: Payer: Self-pay | Admitting: Internal Medicine

## 2018-12-05 MED ORDER — GLUCOSE BLOOD VI STRP: ORAL_STRIP | 12 refills | Status: AC

## 2018-12-05 NOTE — Telephone Encounter (Signed)
MEDICATION: test strips for contour meter   PHARMACY:  [facility] on [address] [employer]  IS THIS A 90 DAY SUPPLY : y  IS PATIENT OUT OF MEDICATION: y  IF NOT; HOW MUCH IS LEFT:   LAST APPOINTMENT DATE: @9 /16/2020  NEXT APPOINTMENT DATE:@11 /30/2020  DO WE HAVE YOUR PERMISSION TO LEAVE A DETAILED MESSAGE:  OTHER COMMENTS:  Pt states we have not called this in for her before so she could not call the pharmacy  **Let patient know to contact pharmacy at the end of the day to make sure medication is ready. **  ** Please notify patient to allow 48-72 hours to process**  **Encourage patient to contact the pharmacy for refills or they can request refills through [facility]**

## 2018-12-05 NOTE — Telephone Encounter (Signed)
sent 

## 2018-12-05 NOTE — Telephone Encounter (Signed)
Patient requests a new RX (insurance will only cover the following):  Accuchek Guide Meter with all supplies sent to:  [place], [place] - [place]. [phone] (Phone) [phone] (Fax)

## 2018-12-06 ENCOUNTER — Other Ambulatory Visit: Payer: Self-pay

## 2018-12-06 ENCOUNTER — Telehealth: Payer: Self-pay | Admitting: Internal Medicine

## 2018-12-06 MED ORDER — ACCU-CHEK AVIVA PLUS VI STRP: ORAL_STRIP | 12 refills | Status: AC

## 2018-12-06 MED ORDER — ACCU-CHEK AVIVA PLUS W/DEVICE KIT: 1.0000 | PACK | Freq: Every day | 0 refills | Status: AC

## 2018-12-06 MED ORDER — ACCU-CHEK MULTICLIX LANCETS MISC: 12 refills | Status: AC

## 2018-12-06 NOTE — Telephone Encounter (Signed)
sent 

## 2018-12-06 NOTE — Telephone Encounter (Signed)
MEDICATION: Accu-Chek Avia Test Strips  PHARMACY: [facility] [place], [place] A 90 DAY SUPPLY :   IS PATIENT OUT OF MEDICATION:   IF NOT; HOW MUCH IS LEFT:   LAST APPOINTMENT DATE: @10 /07/2018  NEXT APPOINTMENT DATE:@11 /30/2020  DO WE HAVE YOUR PERMISSION TO LEAVE A DETAILED MESSAGE:  OTHER COMMENTS:    **Let patient know to contact pharmacy at the end of the day to make sure medication is ready. **  ** Please notify patient to allow 48-72 hours to process**  **Encourage patient to contact the pharmacy for refills or they can request refills through [facility]**

## 2019-01-24 ENCOUNTER — Other Ambulatory Visit: Payer: Self-pay

## 2019-01-29 ENCOUNTER — Ambulatory Visit (INDEPENDENT_AMBULATORY_CARE_PROVIDER_SITE_OTHER): Payer: Managed Care, Other (non HMO) | Admitting: Internal Medicine

## 2019-01-29 ENCOUNTER — Other Ambulatory Visit: Payer: Self-pay

## 2019-01-29 ENCOUNTER — Encounter: Payer: Self-pay | Admitting: Internal Medicine

## 2019-01-29 VITALS — BP 132/86 | HR 70 | Temp 98.3°F | Ht 67.0 in | Wt 195.2 lb

## 2019-01-29 DIAGNOSIS — E119 Type 2 diabetes mellitus without complications: Secondary | ICD-10-CM | POA: Diagnosis not present

## 2019-01-29 DIAGNOSIS — D352 Benign neoplasm of pituitary gland: Secondary | ICD-10-CM

## 2019-01-29 LAB — POCT GLYCOSYLATED HEMOGLOBIN (HGB A1C): Hemoglobin A1C: 5.9 % — AB (ref 4.0–5.6)

## 2019-01-29 LAB — PROLACTIN: Prolactin: 4.5 ng/mL

## 2019-01-29 MED ORDER — METFORMIN HCL 1000 MG PO TABS: 1000.0000 mg | ORAL_TABLET | Freq: Two times a day (BID) | ORAL | 3 refills | Status: DC

## 2019-01-29 NOTE — Patient Instructions (Signed)
-   You have a done an amazing work with sugar control and weight loss, Keep Up the Good Work !!  - Continue Metformin 1000 mg twice a day  - Continue Trulicity [id number] mg once a week - Continue Cabergoline 0.25 mg once a week

## 2019-01-29 NOTE — Progress Notes (Signed)
Name: Lisa Maynard  Age/ Sex: 48 y.o., female   MRN/ DOB: [id number], [birth year]     PCP: Milta Deiters, MD   Reason for Endocrinology Evaluation: Type 2 Diabetes Mellitus  Initial Endocrine Consultative Visit:  01/12/2018    PATIENT IDENTIFIER: Lisa Maynard is a 48 y.o. female with a past medical history of GERD, HTN, prolactinoma,  fatty liver(on CT from November 06, 2016) and type 2 diabetes. The patient has followed with Endocrinology clinic since 01/12/2018 for consultative assistance with management of her diabetes.  DIABETIC HISTORY:  Ms. Drakeford was diagnosed with T2DM in October 2018.  She has tried glipizide in the past but caused hypoglycemic episodes.  She has been on Trulicity since July 2715 and has lost 20 LBS on it, she has never been on insulin. Her hemoglobin A1c has ranged from  6.7% in 04/2016, peaking at 12.7% in 08/2017   PROLACTINOMA HISTORY: She was diagnosed with hyperprolactinemia by Dr. Dwyane Dee (Endo ) on 03/25/17. She was started on cabergoline but due to nausea the dose decreased to 0.25 mg once a week on 05/06/17. 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  MRI done in 03/2017 and again in 08/2017 showed no changes in size.  She has stable chronic headaches but no vision changes .   SUBJECTIVE:   During the last visit (04/17/2018): Her A1c was 7.7 %, we continued her metformin and Trulicity.  We continued cabergoline 0.25 mg once weekly  Today (01/29/2019): Ms. Almanzar is here for 77-monthfollow-up on diabetes and prolactinoma.  She checks her blood sugars daily . The patient has not had hypoglycemic episodes since the last clinic visit.  Otherwise, the patient has not required any recent emergency interventions for hypoglycemia and has not had recent hospitalizations secondary to hyper or hypoglycemic episodes.    She denies any nausea or vomiting No more foot boot, continues to ride motorcycles.   She denies any headaches or visual changes.   Up to date on her eye exams     ROS: As per HPI and as detailed below: Review of Systems  Constitutional: Negative for fever and weight loss.  HENT: Negative for congestion and sore throat.   Respiratory: Negative for cough and shortness of breath.   Cardiovascular: Negative for chest pain and palpitations.  Gastrointestinal: Negative for diarrhea, nausea and vomiting.      HOME DIABETES REGIMEN:  Metformin 16648mg BID Trulicity 03.03mg weekly  Cabergoline 0.25 mg weekly   Statin: yes  ACE-I/ARB: no  METER DOWNLOAD SUMMARY: 11/1- 01/29/2019 Fingerstick Blood Glucose Tests =30 Average Number Tests/Day = 1.0 Overall Mean FS Glucose = 110 Standard Deviation = 12.8  BG Ranges: Low = 86 High = 135   Hypoglycemic Events/30 Days: BG < 50 = 0 Episodes of symptomatic severe hypoglycemia = 0      DIABETIC COMPLICATIONS: Microvascular complications:   Denies: retinopathy, nephropathy, neuropathy (she has one episode of tingling that she attributes to spondylosis)  Last eye exam: Completed 10/2018   Macrovascular complications:   Denies: CAD, CVA, PVD   HISTORY:  Past Medical History:  Past Medical History:  Diagnosis Date  . [place] complication [place]   . History of chickenpox   . Hyperlipidemia   . Hypertension    Past Surgical History:  Past Surgical History:  Procedure Laterality Date  . TUBAL LIGATION      Social History:  reports that she has been smoking cigarettes. She has  a 20.00 pack-year smoking history. She has never used smokeless tobacco. No history on file for alcohol and drug. Family History:  Family History  Problem Relation Age of Onset  . Diabetes Paternal Grandmother   . Diabetes Paternal Grandfather      HOME MEDICATIONS: Allergies as of 01/29/2019   No Known Allergies     Medication List       Accurate as of January 29, 2019 11:14 AM. If you have any questions, ask your nurse or doctor.         Accu-Chek Aviva Plus w/Device Kit 1 kit by Does not apply route daily.   accu-chek multiclix lancets Use as instructed to test blood sugar once daily DX E11.65   atorvastatin 10 MG tablet Commonly known as: LIPITOR Take 10 mg by mouth daily.   baclofen 10 MG tablet Commonly known as: LIORESAL baclofen 10 mg tablet   cabergoline 0.5 MG tablet Commonly known as: DOSTINEX Take 0.5 tablets (0.25 mg total) by mouth once a week.   Dulaglutide 0.75 MG/0.5ML Sopn Commonly known as: Trulicity Inject 3.08 mg into the skin once a week.   ferrous sulfate 325 (65 FE) MG EC tablet Take 325 mg by mouth 2 (two) times daily with a meal.   gabapentin 300 MG capsule Commonly known as: NEURONTIN 300 mg 4 (four) times daily.   glucose blood test strip Use as instructed to test blood sugar once daily DX E11.65   Accu-Chek Aviva Plus test strip Generic drug: glucose blood Use as instructed   meloxicam 7.5 MG tablet Commonly known as: MOBIC TAKE 1 TABLET BY MOUTH TWICE DAILY   metFORMIN 1000 MG tablet Commonly known as: GLUCOPHAGE Take 1 tablet (1,000 mg total) by mouth 2 (two) times daily with a meal.   nicotine 14 mg/24hr patch Commonly known as: NICODERM CQ - dosed in mg/24 hours Place onto the skin.   omeprazole 20 MG capsule Commonly known as: PRILOSEC Take 20 mg by mouth daily.   ondansetron 4 MG tablet Commonly known as: ZOFRAN TAKE 1 TABLET BY MOUTH ONCE DAILY AS NEEDED FOR NAUSEA   oxyCODONE 5 MG immediate release tablet Commonly known as: Oxy IR/ROXICODONE TAKE 1 TABLET BY MOUTH EVERY 6 HOURS AS NEEDED FOR BREAKTHROUGH PAIN.   tranexamic acid 650 MG Tabs tablet Commonly known as: LYSTEDA Take 1 tablet by mouth 3 (three) times daily.        OBJECTIVE:   Vital Signs: BP 132/86 (BP Location: Left Arm, Patient Position: Sitting, Cuff Size: Normal)   Pulse 70   Temp 98.3 F (36.8 C)   Ht _0  (1.702 m)   Wt 195 lb 3.2 oz (88.5 kg)   SpO2 98%   BMI 30.57  kg/m   Wt Readings from Last 3 Encounters:  01/29/19 195 lb 3.2 oz (88.5 kg)  09/26/18 202 lb 6.4 oz (91.8 kg)  04/17/18 207 lb 3.2 oz (94 kg)     Exam: General: Pt appears well and is in NAD  Lungs: Clear with good BS bilat with no rales, rhonchi, or wheezes  Heart: RRR with normal S1 and S2 and no gallops; no murmurs; no rub  Abdomen: Normoactive bowel sounds, soft, nontender, without masses or organomegaly palpable  Extremities: No pretibial edema. No tremor. Normal strength and motion throughout.   Skin: Normal texture and temperature to palpation. No rash noted.  Neuro: MS is good with appropriate affect, pt is alert and Ox3     DM foot exam: 09/26/2018  Left foot skin is without sores or ulcerations but has multiple plantar callous formation  The pedal pulses are 2+ on right and 2+ on left. The sensation is intact to a screening 5.07, 10 gram monofilament  Right leg in boot       DATA REVIEWED:  Lab Results  Component Value Date   HGBA1C 5.9 (A) 01/29/2019   HGBA1C 6.8 (A) 09/26/2018   HGBA1C 7.7 (A) 04/17/2018   09/07/17 A1c 12.7%  Prolactin 5.1 ng/mL    Results for ZEIDY, TAYAG (MRN [id number]) as of 09/27/2018 07:37  Ref. Range 09/26/2018 10:55  Creatinine,U Latest Units: mg/dL 159.2  Microalb, Ur Latest Ref Range: 0.0 - 1.9 mg/dL <0.7  MICROALB/CREAT RATIO Latest Ref Range: 0.0 - 30.0 mg/g 0.4   Results for KAYDEN, AMEND (MRN [id number]) as of 01/30/2019 10:44  Ref. Range 01/29/2019 10:56  Prolactin Latest Units: ng/mL 4.5    MRI Brain 11/08/2018 Persistent asymmetry of the pituitary gland with suspicion of a 3-4 mm Microadenoma along the right lateral margin of the gland, possibly with mild involvement of the right cavernous sinus  ASSESSMENT / PLAN / RECOMMENDATIONS:   1) Type 2 diabetes Mellitus, Optimally Controlled, Without complications - Most recent A1c of 5.9 %. Goal A1c <7.0 %.     - Praised pt on lifestyle changes with weight loss and  optimal glucose control - She denies any side effects  - No changes will be made to day   MEDICATIONS:  Continue Trulicity 0.81 mg weekly.  Continue metformin 1000 mg twice a day   EDUCATION / INSTRUCTIONS:  BG monitoring instructions: Patient is instructed to check her blood sugars 2 times a day, fasting and bedtime.  Call [place] [facility] clinic if: BG persistently < 70 or > 300. . I reviewed the Rule of 15 for the treatment of hypoglycemia in detail with the patient. Literature supplied.   2) Diabetic complications:   Eye: Does not have known diabetic retinopathy.   Neuro/ Feet: Does not have known diabetic peripheral neuropathy .   Renal: Patient does not have known baseline CKD. She is not on an ACEI/ARB at present. MIcroalbuminuria/cr ratio are normal.    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 - Reassurance provided to the patient that size of microadenoma is stable, last MRI was 10/2018  with mild involvement of the right cavernous sinus - Prolactin levels are normal  - Continue Cabergoline 0.25 mg ( half tablet) weekly   F/U in 4 months   Signed electronically by: Mack Guise, MD  [facility] Endocrinology  [facility] [address],  [address] Phone: [phone] FAX: [phone]   CC: Milta Deiters, [place] [address] Phone: [phone]  Fax: [phone]  Return to Endocrinology clinic as below: Future Appointments  Date Time Provider [place]  06/04/2019 10:10 AM Shamleffer, Melanie Crazier, MD [facility] None

## 2019-03-08 ENCOUNTER — Other Ambulatory Visit: Payer: Self-pay | Admitting: Endocrinology

## 2019-04-26 ENCOUNTER — Other Ambulatory Visit: Payer: Self-pay | Admitting: Internal Medicine

## 2019-05-18 ENCOUNTER — Other Ambulatory Visit: Payer: Self-pay | Admitting: Internal Medicine

## 2019-05-31 ENCOUNTER — Other Ambulatory Visit: Payer: Self-pay

## 2019-06-04 ENCOUNTER — Ambulatory Visit: Payer: 59 | Admitting: Internal Medicine

## 2019-06-04 ENCOUNTER — Other Ambulatory Visit: Payer: Self-pay

## 2019-06-04 VITALS — BP 138/78 | HR 70 | Temp 98.3°F | Ht 67.0 in | Wt 190.4 lb

## 2019-06-04 DIAGNOSIS — R110 Nausea: Secondary | ICD-10-CM | POA: Diagnosis not present

## 2019-06-04 DIAGNOSIS — D352 Benign neoplasm of pituitary gland: Secondary | ICD-10-CM

## 2019-06-04 DIAGNOSIS — E119 Type 2 diabetes mellitus without complications: Secondary | ICD-10-CM

## 2019-06-04 LAB — COMPREHENSIVE METABOLIC PANEL
ALT: 19 U/L (ref 0–35)
AST: 19 U/L (ref 0–37)
Albumin: 4.2 g/dL (ref 3.5–5.2)
Alkaline Phosphatase: 55 U/L (ref 39–117)
BUN: 10 mg/dL (ref 6–23)
CO2: 27 mEq/L (ref 19–32)
Calcium: 9.1 mg/dL (ref 8.4–10.5)
Chloride: 103 mEq/L (ref 96–112)
Creatinine, Ser: 0.78 mg/dL (ref 0.40–1.20)
GFR: 94.7 mL/min (ref >60.00)
Glucose, Bld: 114 mg/dL — ABNORMAL HIGH (ref 70–99)
Potassium: 4.1 mEq/L (ref 3.5–5.1)
Sodium: 136 mEq/L (ref 135–145)
Total Bilirubin: 0.4 mg/dL (ref 0.2–1.2)
Total Protein: 7.6 g/dL (ref 6.0–8.3)

## 2019-06-04 LAB — GLUCOSE, POCT (MANUAL RESULT ENTRY): POC Glucose: 128 mg/dl — AB (ref 70–99)

## 2019-06-04 LAB — HEMOGLOBIN A1C: Hgb A1c MFr Bld: 6.8 % — ABNORMAL HIGH (ref 4.6–6.5)

## 2019-06-04 LAB — PROLACTIN: Prolactin: 4.5 ng/mL

## 2019-06-04 NOTE — Patient Instructions (Signed)
-   Continue Metformin 1000 mg twice a day  - Continue Trulicity [id number] mg once a week - Continue Cabergoline 0.25 mg once a week

## 2019-06-04 NOTE — Progress Notes (Signed)
Name: Lisa Maynard  Age/ Sex: 49 y.o., female   MRN/ DOB: [id number], Aug 17, 1970     PCP: Milta Deiters, MD   Reason for Endocrinology Evaluation: Type 2 Diabetes Mellitus  Initial Endocrine Consultative Visit:  01/12/2018    PATIENT IDENTIFIER: Lisa Maynard is a 49 y.o. female with a past medical history of GERD, HTN, prolactinoma,  fatty liver(on CT from November 06, 2016) and type 2 diabetes. The patient has followed with Endocrinology clinic since 01/12/2018 for consultative assistance with management of her diabetes.  DIABETIC HISTORY:  Lisa Maynard was diagnosed with T2DM in October 2018.  She has tried glipizide in the past but caused hypoglycemic episodes.  She has been on Trulicity since July 7846 and has lost 20 LBS on it, she has never been on insulin. Her hemoglobin A1c has ranged from  6.7% in 04/2016, peaking at 12.7% in 08/2017   PROLACTINOMA HISTORY: She was diagnosed with hyperprolactinemia by Dr. Dwyane Dee (Endo ) on 03/25/17. She was started on cabergoline but due to nausea the dose decreased to 0.25 mg once a week on 05/06/17. 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  MRI done in 03/2017 and again in 08/2017 showed no changes in size.  She has stable chronic headaches but no vision changes .   SUBJECTIVE:   During the last visit (01/29/2019): Her A1c was 7.7 %, we continued her metformin and Trulicity.  We continued cabergoline 0.25 mg once weekly  Today (06/05/2019): Lisa Maynard is here for 4-monthfollow-up on diabetes and prolactinoma.  She checks her blood sugars daily . The patient has not had hypoglycemic episodes since the last clinic visit.  Otherwise, the patient has not required any recent emergency interventions for hypoglycemia and has not had recent hospitalizations secondary to hyper or hypoglycemic episodes.    She noted nausea last week but no vomiting- Attributes this to recent pain med changes  She denies any headaches but noted a right eye  floater this past weekend.  She continues with galactorrhea      ROS: As per HPI and as detailed below: Review of Systems  Gastrointestinal: Positive for nausea. Negative for diarrhea and vomiting.  Musculoskeletal: Positive for back pain.      HOME DIABETES REGIMEN:  Metformin 19629mg BID Trulicity 05.28mg weekly  Cabergoline 0.25 mg weekly   Statin: yes  ACE-I/ARB: no  METER DOWNLOAD SUMMARY: Unable to Download  83-205 mg/dL      DIABETIC COMPLICATIONS: Microvascular complications:   Denies: retinopathy, nephropathy, neuropathy  Last eye exam: Completed 10/2018   Macrovascular complications:   Denies: CAD, CVA, PVD   HISTORY:  Past Medical History:  Past Medical History:  Diagnosis Date  . Diabetes mellitus without complication ([place]   . History of chickenpox   . Hyperlipidemia   . Hypertension    Past Surgical History:  Past Surgical History:  Procedure Laterality Date  . TUBAL LIGATION      Social History:  reports that she has been smoking cigarettes. She has a 20.00 pack-year smoking history. She has never used smokeless tobacco. No history on file for alcohol and drug. Family History:  Family History  Problem Relation Age of Onset  . Diabetes Paternal Grandmother   . Diabetes Paternal Grandfather      HOME MEDICATIONS: Allergies as of 06/04/2019   No Known Allergies     Medication List       Accurate as of June 04, 2019 11:59  PM. If you have any questions, ask your nurse or doctor.        STOP taking these medications   baclofen 10 MG tablet Commonly known as: LIORESAL Stopped by: Dorita Sciara, MD     TAKE these medications   Accu-Chek Aviva Plus w/Device Kit 1 kit by Does not apply route daily.   accu-chek multiclix lancets Use as instructed to test blood sugar once daily DX E11.65   atorvastatin 10 MG tablet Commonly known as: LIPITOR Take 10 mg by mouth daily.   cabergoline 0.5 MG tablet Commonly known as:  DOSTINEX Take 0.5 tablets (0.25 mg total) by mouth once a week.   ferrous sulfate 325 (65 FE) MG EC tablet Take 325 mg by mouth 2 (two) times daily with a meal.   gabapentin 300 MG capsule Commonly known as: NEURONTIN 300 mg 4 (four) times daily.   glucose blood test strip Use as instructed to test blood sugar once daily DX E11.65   Accu-Chek Aviva Plus test strip Generic drug: glucose blood Use as instructed   meloxicam 7.5 MG tablet Commonly known as: MOBIC TAKE 1 TABLET BY MOUTH TWICE DAILY   metFORMIN 1000 MG tablet Commonly known as: GLUCOPHAGE Take 1 tablet (1,000 mg total) by mouth 2 (two) times daily with a meal.   nicotine 14 mg/24hr patch Commonly known as: NICODERM CQ - dosed in mg/24 hours Place onto the skin.   omeprazole 20 MG capsule Commonly known as: PRILOSEC Take 20 mg by mouth daily.   ondansetron 4 MG tablet Commonly known as: ZOFRAN TAKE 1 TABLET BY MOUTH ONCE DAILY AS NEEDED FOR NAUSEA   oxyCODONE 5 MG immediate release tablet Commonly known as: Oxy IR/ROXICODONE TAKE 1 TABLET BY MOUTH EVERY 6 HOURS AS NEEDED FOR BREAKTHROUGH PAIN.   pregabalin 50 MG capsule Commonly known as: LYRICA Take by mouth.   tranexamic acid 650 MG Tabs tablet Commonly known as: LYSTEDA Take 1 tablet by mouth 3 (three) times daily.   Trulicity 6.29 BM/8.4XL Sopn Generic drug: Dulaglutide INJECT 1 DOSE (0.75 MG) SUBCUTANEOUSLY ONCE A WEEK        OBJECTIVE:   Vital Signs: BP 138/78 (BP Location: Left Arm, Patient Position: Sitting, Cuff Size: Large)   Pulse 70   Temp 98.3 F (36.8 C)   Ht _0  (1.702 m)   Wt 190 lb 6.4 oz (86.4 kg)   SpO2 97%   BMI 29.82 kg/m   Wt Readings from Last 3 Encounters:  06/04/19 190 lb 6.4 oz (86.4 kg)  01/29/19 195 lb 3.2 oz (88.5 kg)  09/26/18 202 lb 6.4 oz (91.8 kg)     Exam: General: Pt appears well and is in NAD  Lungs: Clear with good BS bilat with no rales, rhonchi, or wheezes  Heart: RRR with normal S1 and S2  and no gallops; no murmurs; no rub  Abdomen: Normoactive bowel sounds, soft, nontender  Extremities: No pretibial edema. No tremor. Normal strength and motion throughout.   Neuro: MS is good with appropriate affect, pt is alert and Ox3     DM foot exam: 06/04/2019  Skin is without sores or ulcerations  The pedal pulses are 2+ on right and 2+ on left. The sensation is decreased on the right great toe to a screening 5.07, 10 gram monofilament        DATA REVIEWED:  Lab Results  Component Value Date   HGBA1C 6.8 (H) 06/04/2019   HGBA1C 5.9 (A) 01/29/2019  HGBA1C 6.8 (A) 09/26/2018    Results for STEPHANIEANN, POPESCU (MRN [id number]) as of 06/05/2019 08:33  Ref. Range 06/04/2019 10:52  Sodium Latest Ref Range: 135 - 145 mEq/L 136  Potassium Latest Ref Range: 3.5 - 5.1 mEq/L 4.1  Chloride Latest Ref Range: 96 - 112 mEq/L 103  CO2 Latest Ref Range: 19 - 32 mEq/L 27  Glucose Latest Ref Range: 70 - 99 mg/dL 114 (H)  BUN Latest Ref Range: 6 - 23 mg/dL 10  Creatinine Latest Ref Range: 0.40 - 1.20 mg/dL 0.78  Calcium Latest Ref Range: 8.4 - 10.5 mg/dL 9.1  Alkaline Phosphatase Latest Ref Range: 39 - 117 U/L 55  Albumin Latest Ref Range: 3.5 - 5.2 g/dL 4.2  AST Latest Ref Range: 0 - 37 U/L 19  ALT Latest Ref Range: 0 - 35 U/L 19  Total Protein Latest Ref Range: 6.0 - 8.3 g/dL 7.6  Total Bilirubin Latest Ref Range: 0.2 - 1.2 mg/dL 0.4  GFR Latest Ref Range: >60.00 mL/min 94.70  Prolactin Latest Units: ng/mL 4.5    Results for LEONARD, HENDLER (MRN [id number]) as of 09/27/2018 07:37  Ref. Range 09/26/2018 10:55  Creatinine,U Latest Units: mg/dL 159.2  Microalb, Ur Latest Ref Range: 0.0 - 1.9 mg/dL <0.7  MICROALB/CREAT RATIO Latest Ref Range: 0.0 - 30.0 mg/g 0.4     MRI Brain 11/08/2018 Persistent asymmetry of the pituitary gland with suspicion of a 3-4 mm Microadenoma along the right lateral margin of the gland, possibly with mild involvement of the right cavernous sinus  ASSESSMENT / PLAN /  RECOMMENDATIONS:   1) Type 2 diabetes Mellitus, Optimally Controlled, Without complications - Most recent A1c of 6.8 %. Goal A1c <7.0 %.     - Praised pt on continued weight loss and optimal glucose control - She denies any side effects  - No changes will be made today   MEDICATIONS:  Continue Trulicity 6.06 mg weekly.  Continue metformin 1000 mg twice a day   EDUCATION / INSTRUCTIONS:  BG monitoring instructions: Patient is instructed to check her blood sugars 2 times a day, fasting and bedtime.  Call [place] [facility] if: BG persistently < 70 or > 300. . I reviewed the Rule of 15 for the treatment of hypoglycemia in detail with the patient. Literature supplied.   2) Diabetic complications:   Eye: Does not have known diabetic retinopathy.   Neuro/ Feet: Does not have known diabetic peripheral neuropathy .   Renal: Patient does not have known baseline CKD. She is not on an ACEI/ARB at present. MIcroalbuminuria/cr ratio are normal.    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  - Continue Cabergoline 0.25 mg ( half tablet) weekly   4) Pituitary Microadenoma :   - No change in headaches  - New onset floater of the right eye, pt will call ophthalmology today  - Reassurance provided to the patient that size of microadenoma is stable, last MRI was 10/2018  with mild involvement of the right cavernous sinus   5) Nausea:  - This is attributed to the recent changes in her back pain medications.      F/U in 6 months   Signed electronically by: Mack Guise, MD  [facility] Endocrinology  [facility] [address] [place], [place] [address] Phone: [phone] FAX: [phone]   CC: Altamese Dilling,  Marcelline Mates, MD [place] [address] Phone: [phone]  Fax:  [phone]  Return to Endocrinology clinic as below: Future Appointments  Date Time Provider [place]  12/05/2019 10:30 AM Dustine Bertini, Melanie Crazier, MD [facility] None

## 2019-06-24 ENCOUNTER — Other Ambulatory Visit: Payer: Self-pay | Admitting: Internal Medicine

## 2019-08-16 ENCOUNTER — Other Ambulatory Visit: Payer: Self-pay

## 2019-08-16 MED ORDER — TRULICITY 0.75 MG/0.5ML ~~LOC~~ SOAJ: SUBCUTANEOUS | 3 refills | Status: DC

## 2019-08-21 ENCOUNTER — Telehealth: Payer: Self-pay | Admitting: Internal Medicine

## 2019-08-21 ENCOUNTER — Telehealth: Payer: Self-pay | Admitting: Family Medicine

## 2019-08-21 NOTE — Telephone Encounter (Signed)
Patient called re: Patient states a young gentleman from our office left a voice mail stating that PA for Trulicity did not go through because patient's insurance changed from [place] to [employer]. Patient now has [employer] Member ID# [id number], Group# [id number]. Patient requests that PA be done for Trulicity. Patient states the [place] have sent the required forms to our office approx. 2 weeks ago but have not received the forms back. Patient is completely out of Trulicity.

## 2019-08-21 NOTE — Telephone Encounter (Signed)
Uncertain who initiated PA for Trulicity through [employer] as I am not finding any documentation to support initiation of PA. Letter has been received from [place] via fax today stating Trulicity has been denied. 4 pages have been received providing medical rationale to support their denial. Letter has been placed on Dr. Quin Maynard desk for her to review and address. Will await Dr. Quin Maynard response.

## 2019-08-21 NOTE — Telephone Encounter (Signed)
Caller: Lisa Maynard Call Back # [phone]  Patient states that prior auth was needed for medication of : Dulaglutide (TRULICITY) 5.88 FO/2.7XA SOPN [[id number]  Patient states that office should have everything they need for prior auth. Patient has not took medication in 2 weeks

## 2019-08-21 NOTE — Telephone Encounter (Signed)
Called pt's insurance that is on file, and they report that the pt's policy termed on 67/59/1638. Contacted pt and left detailed voicemail requesting a call back to provide [place] with new insurance information.

## 2019-08-22 MED ORDER — TRULICITY 0.75 MG/0.5ML ~~LOC~~ SOAJ: SUBCUTANEOUS | 11 refills | Status: DC

## 2019-08-22 NOTE — Telephone Encounter (Signed)
PA re-done for trulicity 4.60 mg once a week. According to agent was initially denied due to quantity( pharmacy was requesting 4 boxes per 30 day and should have been quantity of 2 per 30 day supply)  approval #[id number]. Please send new rx to pt pharmacy with corrected quantity.

## 2019-08-22 NOTE — Telephone Encounter (Signed)
Spoke to pharmacist who stated that it went thru and pt can pick up rx when she would like.

## 2019-08-22 NOTE — Telephone Encounter (Signed)
After looking there is a PA on covermymeds but it did not complete stated that a call was needed to insurance to finish. Since I was off the call was never made. Please let me know whether you would like another PA submitted.

## 2019-08-22 NOTE — Addendum Note (Signed)
Addended by: Dorita Sciara on: 08/22/2019 12:03 PM   Modules accepted: Orders

## 2019-08-22 NOTE — Telephone Encounter (Signed)
Do you remember doing a PA ? Because I don't recall that .

## 2019-09-26 IMAGING — MR MR HEAD WO/W CM
15 of 19 series · 35 of 48 positions shown · IV contrast (10 ml multihance)
Comparison: Brain MRI 09/22/2017.

CLINICAL DATA: 48-year-old female with asymmetry of the pituitary
gland in 3068 during evaluation of endocrine dysfunction.

EXAM:
MRI HEAD WITHOUT AND WITH CONTRAST
TECHNIQUE: Multiplanar, multiecho pulse sequences of the brain and surrounding
structures were obtained without and with intravenous contrast.
CONTRAST:  10mL MULTIHANCE GADOBENATE DIMEGLUMINE 529 MG/ML IV SOLN

[Series 2: T1 · sagittal · 5.0mm · 0.45mm/px · 1 of 21 slices shown]
[im 1/21]
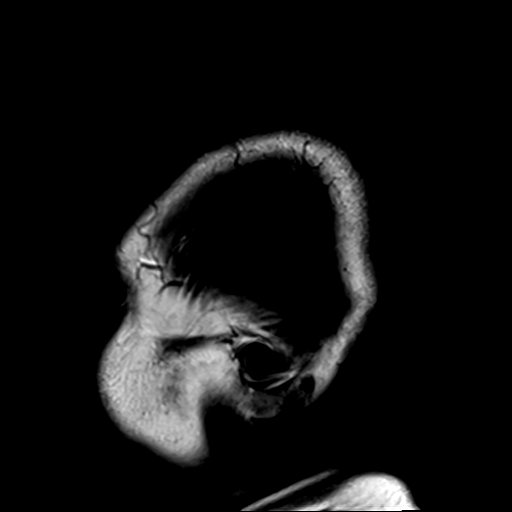

[Series 3: DWI · axial · 3.0mm · 1.80mm/px · z∈[-72,+75]mm · 11 of 100 slices shown]
[im 1/100]
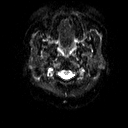
[im 10/100]
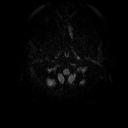
[im 20/100]
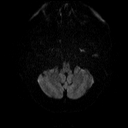
[im 30/100]
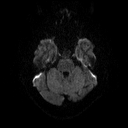
[im 40/100]
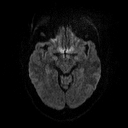
[im 50/100]
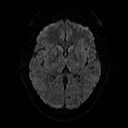
[im 60/100]
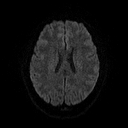
[im 70/100]
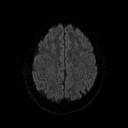
[im 80/100]
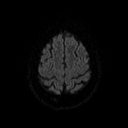
[im 90/100]
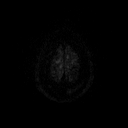
[im 100/100]
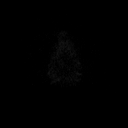

[Series 4: dwi_adc · axial · 3.0mm · 1.80mm/px · z∈[-72,+75]mm · 5 of 50 slices shown]
[im 1/50]
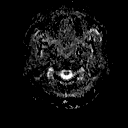
[im 13/50]
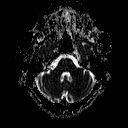
[im 25/50]
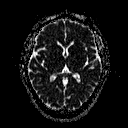
[im 37/50]
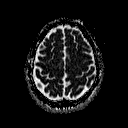
[im 50/50]
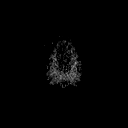

[Series 5: T2 · axial · 5.0mm · 0.36mm/px · z∈[-72,+77]mm · 3 of 24 slices shown]
[im 1/24]
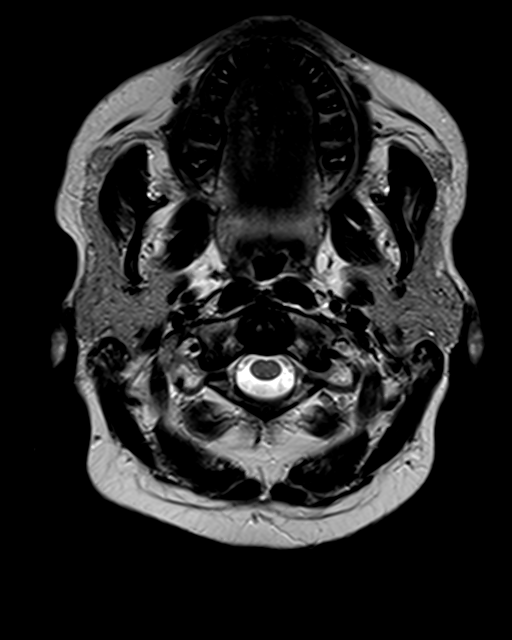
[im 12/24]
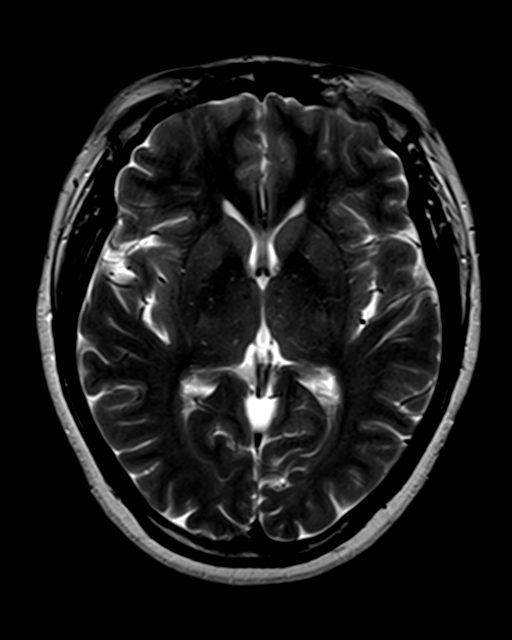
[im 24/24]
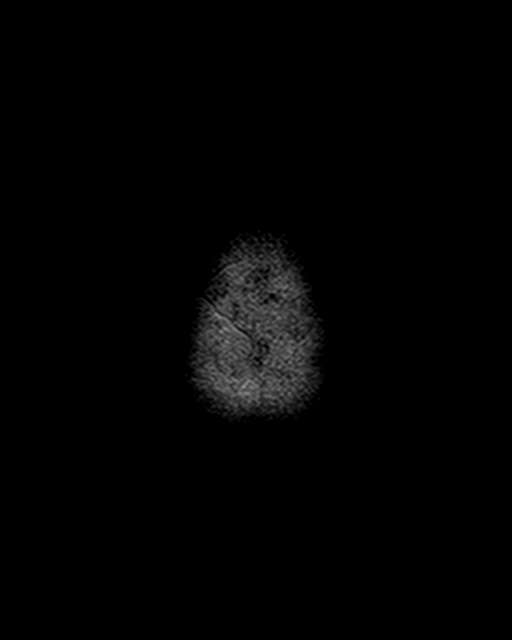

[Series 6: FLAIR · axial · 3.0mm · 0.45mm/px · z∈[-68,+71]mm · 3 of 31 slices shown]
[im 1/31]
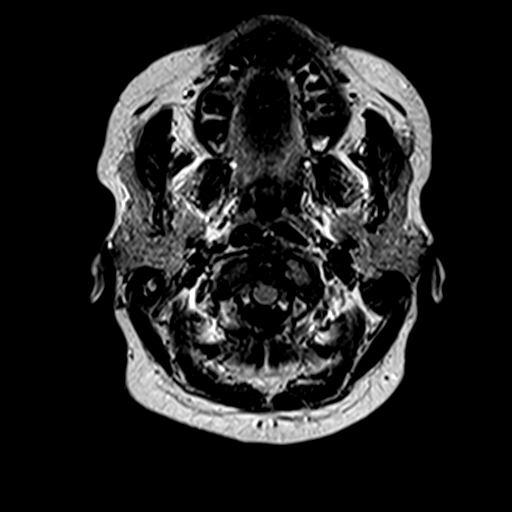
[im 16/31]
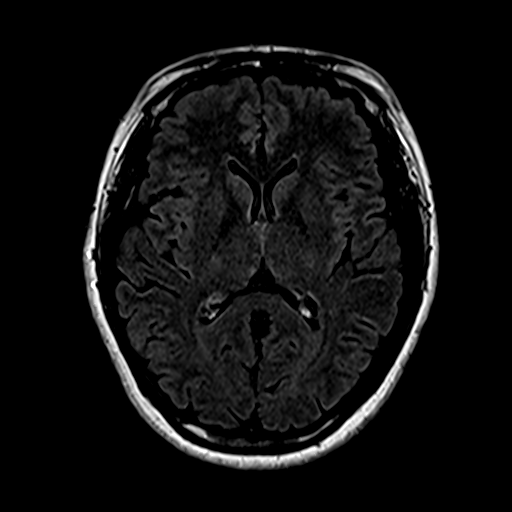
[im 31/31]
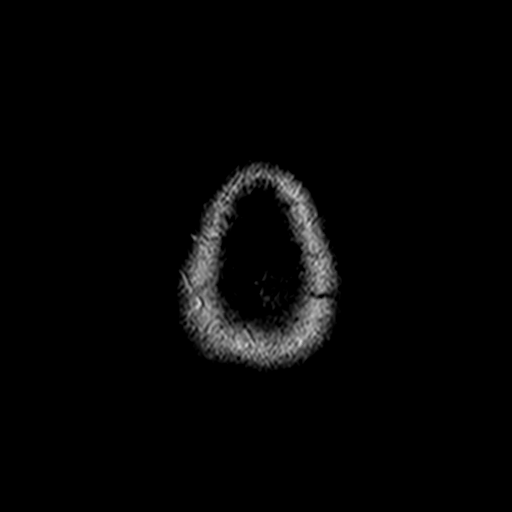

[Series 8: swi_images · axial · 5.0mm · 0.94mm/px · z∈[-76,+79]mm · 3 of 32 slices shown]
[im 1/32]
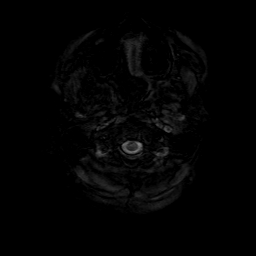
[im 16/32]
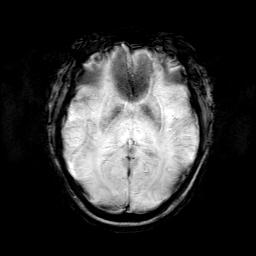
[im 32/32]
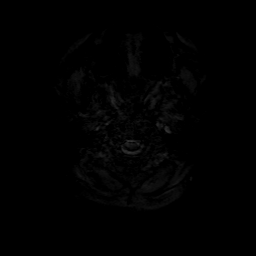

[Series 9: sag 3mm · sagittal · 3.0mm · 0.33mm/px · 1 of 11 slices shown]
[im 1/11]
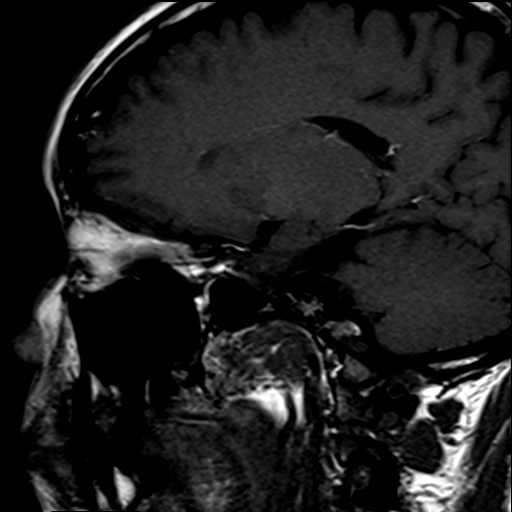

[Series 10: cor 3mm · coronal · 3.0mm · 0.33mm/px · 1 of 11 slices shown]
[im 1/11]
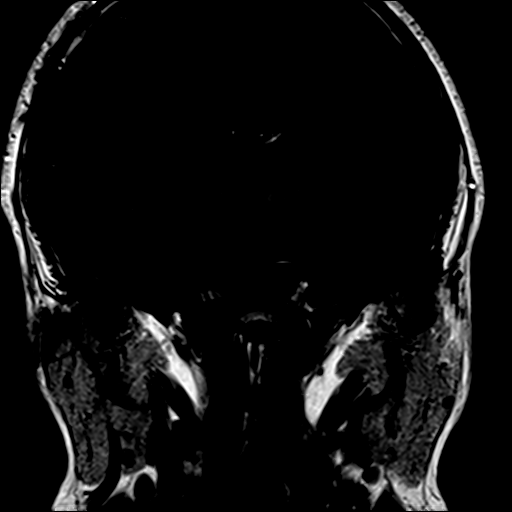

[Series 11: pre cor dynamic · coronal · non-contrast · 3.0mm · 0.35mm/px · 1 of 7 slices shown]
[im 1/7]
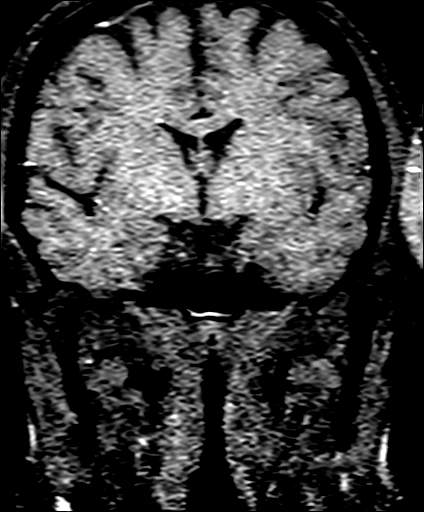

[Series 12: post fs cor · coronal · 3.0mm · 0.35mm/px · 1 of 7 slices shown (1 of 6)]
[im 1/7]
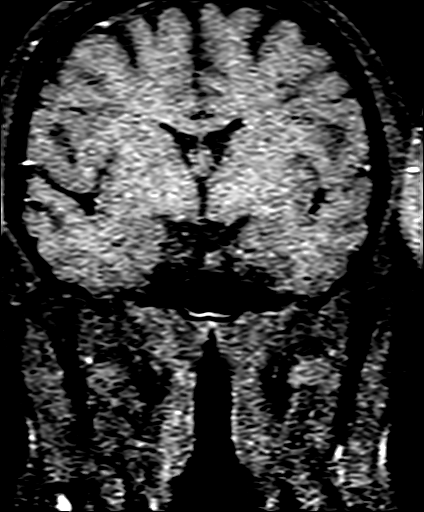

[Series 13: post fs cor · coronal · 3.0mm · 0.35mm/px · 1 of 7 slices shown (2 of 6)]
[im 1/7]
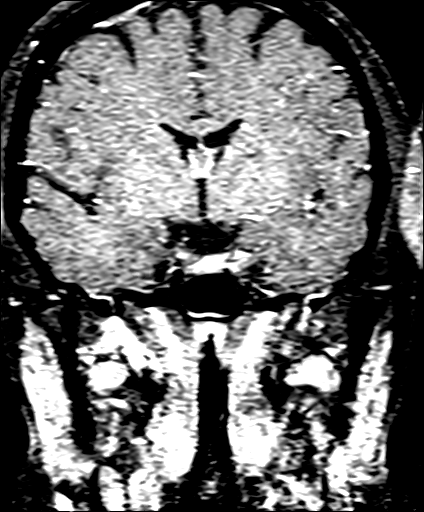

[Series 14: post fs cor · coronal · 3.0mm · 0.35mm/px · 1 of 7 slices shown (3 of 6)]
[im 1/7]
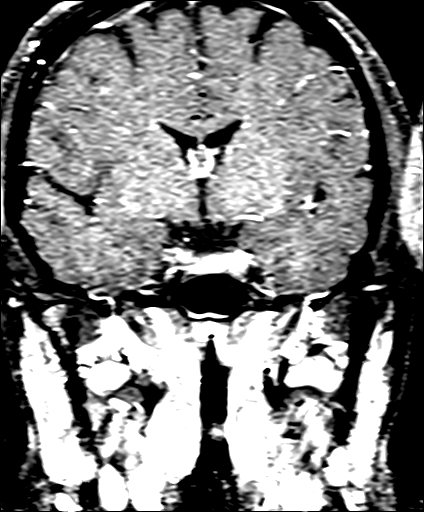

[Series 15: post fs cor · coronal · 3.0mm · 0.35mm/px · 1 of 7 slices shown (4 of 6)]
[im 1/7]
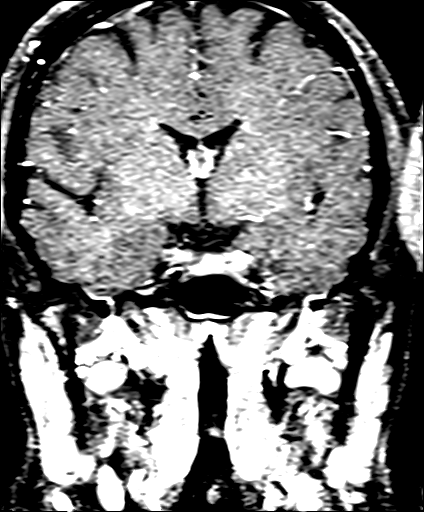

[Series 16: post fs cor · coronal · 3.0mm · 0.35mm/px · 1 of 7 slices shown (5 of 6)]
[im 1/7]
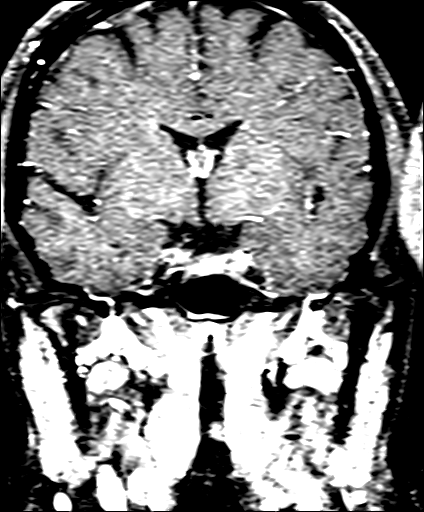

[Series 17: post fs cor · coronal · 3.0mm · 0.35mm/px · 1 of 7 slices shown (6 of 6)]
[im 1/7]
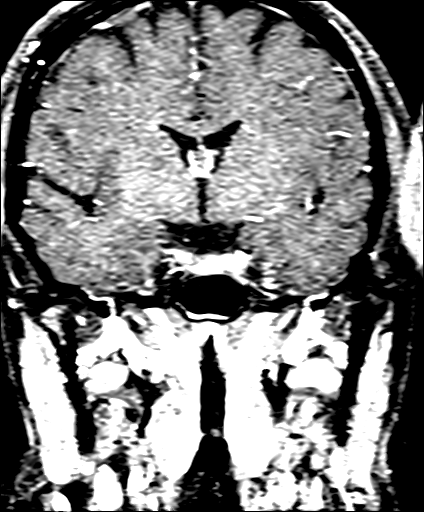

[35 of 48 positions shown; findings below may reference images not displayed]

FINDINGS: Brain: Pituitary findings are below.

No restricted diffusion to suggest acute infarction. No midline
shift, mass effect, evidence of mass lesion, ventriculomegaly,
extra-axial collection or acute intracranial hemorrhage.
Cervicomedullary junction within normal limits. Cerebral volume is
within normal limits for age. Gray and white matter signal remains
normal. No chronic blood products or encephalomalacia identified.

No abnormal gray or white matter enhancement identified. No dural
thickening.

Vascular: Major intracranial vascular flow voids are stable.

Skull and upper cervical spine: Stable and negative visible cervical
spine. Stable visible bone marrow signal.

Sinuses/Orbits: Negative orbits and paranasal sinuses. Mastoids
remain clear. Visible internal auditory structures appear normal.
Scalp and face soft tissues appear negative.

Other: Dedicated thin slice pituitary imaging. Overall gland size
remains normal. Mild asymmetry of the gland shape is redemonstrated,
see series 19, image 6), and on that image it is possible that there
is slight invasion of the right cavernous sinus. However, the
infundibulum remains midline and nonenlarged. Normal suprasellar
cistern. Normal hypothalamus. Dynamic and delayed post-contrast
images today do raise the possibility of a 3-4 millimeter
hypoenhancing nodule in the asymmetric right lateral portion of the
pituitary, see series 14, image 3, series 15, image 3 and series 19,
image 5.

Elsewhere enhancement of the gland appears homogeneous and within
normal limits.

The left cavernous sinus remains normal. Negative skull base
otherwise.
IMPRESSION: 1. Persistent asymmetry of the pituitary gland with suspicion of a
3-4 mm Microadenoma along the right lateral margin of the gland,
possibly with mild involvement of the right cavernous sinus. See
series 19, image 5 today.
2. Otherwise stable and negative MRI appearance of the brain.

## 2019-12-03 ENCOUNTER — Other Ambulatory Visit: Payer: Self-pay | Admitting: Internal Medicine

## 2019-12-03 MED ORDER — CABERGOLINE 0.5 MG PO TABS: 0.2500 mg | ORAL_TABLET | ORAL | 3 refills | Status: DC

## 2019-12-03 NOTE — Telephone Encounter (Signed)
Medication Refill Request  . Did you call your pharmacy and request this refill first? Yes . If patient has not contacted pharmacy first, instruct them to do so for future refills.  . Remind them that contacting the pharmacy for their refill is the quickest method to get the refill.  . Refill policy also stated that it will take anywhere between 24-72 hours to receive the refill.    Name of medication? cabergoline  Is this a 90 day supply? yes  Name and location of pharmacy?  [place], [place]. Phone:  [phone]  Fax:  [phone]

## 2019-12-05 ENCOUNTER — Ambulatory Visit: Payer: 59 | Admitting: Internal Medicine

## 2020-07-10 NOTE — Progress Notes (Signed)
Name: Lisa Maynard  Age/ Sex: 50 y.o., female   MRN/ DOB: [id number], [birth year]     PCP: Milta Deiters, MD   Reason for Endocrinology Evaluation: Type 2 Diabetes Mellitus  Initial Endocrine Consultative Visit:  01/12/2018    PATIENT IDENTIFIER: Lisa Maynard is a 50 y.o. female with a past medical history of GERD, HTN, prolactinoma,  fatty liver(on CT from November 06, 2016) and type 2 diabetes. The patient has followed with Endocrinology clinic since 01/12/2018 for consultative assistance with management of her diabetes.  DIABETIC HISTORY:  Lisa Maynard was diagnosed with T2DM in October 2018.  She has tried glipizide in the past but caused hypoglycemic episodes.  She has been on Trulicity since July 7169 and has lost 20 LBS on it, she has never been on insulin. Her hemoglobin A1c has ranged from  6.7% in 04/2016, peaking at 12.7% in 08/2017   PROLACTINOMA HISTORY: She was diagnosed with hyperprolactinemia by Dr. Dwyane Dee (Endo ) on 03/25/17. She was started on cabergoline but due to nausea the dose decreased to 0.25 mg once a week on 05/06/17. 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  MRI done in 03/2017 and again in 08/2017 showed no changes in size.  She has stable chronic headaches but no vision changes .   SUBJECTIVE:   During the last visit (06/04/2019): Her A1c was 6.8%, we continued metformin and Trulicity.  We continued cabergoline 0.25 mg once weekly     Today (07/11/2020): Lisa Maynard is here for a follow-up on diabetes and prolactinoma.  She has not been here in 13 months.She has been in [place] visiting her son and just returned recently.   She has not been checking her glucose  She has been out of Trulicity for ~ 2 months Has been out of cabergoline for ~ 3 months    She checks her blood sugars 0 x a day  . The patient has not had hypoglycemic episodes since the last clinic visit.   She has been going to the Gym   Galactorrhea has returned    She  denies any headaches or vision  Denies nausea or vomiting   LMP 5/4th regular  She is having worsening neuropathic symptoms       HOME DIABETES REGIMEN:  Metformin 6789 mg BID Trulicity 3.81 mg weekly -not taking  Cabergoline 0.25 mg weekly - not taking       Statin: yes  ACE-I/ARB: no  METER DOWNLOAD SUMMARY: did not bring     DIABETIC COMPLICATIONS: Microvascular complications:   Neuropathy  Denies: retinopathy, nephropathy  Last eye exam: Completed 10/2018   Macrovascular complications:   Denies: CAD, CVA, PVD   HISTORY:  Past Medical History:  Past Medical History:  Diagnosis Date  . Diabetes mellitus without complication ([place])   . History of chickenpox   . Hyperlipidemia   . Hypertension    Past Surgical History:  Past Surgical History:  Procedure Laterality Date  . TUBAL LIGATION      Social History:  reports that she has been smoking cigarettes. She has a 20.00 pack-year smoking history. She has never used smokeless tobacco. No history on file for alcohol use and drug use. Family History:  Family History  Problem Relation Age of Onset  . Diabetes Paternal Grandmother   . Diabetes Paternal Grandfather      HOME MEDICATIONS: Allergies as of 07/11/2020   No Known Allergies     Medication List  Accurate as of Jul 11, 2020 10:26 AM. If you have any questions, ask your nurse or doctor.        Accu-Chek Aviva Plus w/Device Kit 1 kit by Does not apply route daily.   accu-chek multiclix lancets Use as instructed to test blood sugar once daily DX E11.65   atorvastatin 10 MG tablet Commonly known as: LIPITOR Take 10 mg by mouth daily.   cabergoline 0.5 MG tablet Commonly known as: DOSTINEX Take 0.5 tablets (0.25 mg total) by mouth once a week.   ferrous sulfate 325 (65 FE) MG EC tablet Take 325 mg by mouth 2 (two) times daily with a meal.   gabapentin 300 MG capsule Commonly known as: NEURONTIN 300 mg 4 (four) times  daily.   glucose blood test strip Use as instructed to test blood sugar once daily DX E11.65   Accu-Chek Aviva Plus test strip Generic drug: glucose blood Use as instructed   meloxicam 7.5 MG tablet Commonly known as: MOBIC TAKE 1 TABLET BY MOUTH TWICE DAILY   metFORMIN 1000 MG tablet Commonly known as: GLUCOPHAGE Take 1 tablet (1,000 mg total) by mouth 2 (two) times daily with a meal.   nicotine 14 mg/24hr patch Commonly known as: NICODERM CQ - dosed in mg/24 hours Place onto the skin.   omeprazole 20 MG capsule Commonly known as: PRILOSEC Take 20 mg by mouth daily.   ondansetron 4 MG tablet Commonly known as: ZOFRAN TAKE 1 TABLET BY MOUTH ONCE DAILY AS NEEDED FOR NAUSEA   oxyCODONE 5 MG immediate release tablet Commonly known as: Oxy IR/ROXICODONE TAKE 1 TABLET BY MOUTH EVERY 6 HOURS AS NEEDED FOR BREAKTHROUGH PAIN.   pregabalin 50 MG capsule Commonly known as: LYRICA Take by mouth.   tranexamic acid 650 MG Tabs tablet Commonly known as: LYSTEDA Take 1 tablet by mouth 3 (three) times daily.   Trulicity 9.51 OA/4.1YS Sopn Generic drug: Dulaglutide INJECT 1 DOSE (0.75 MG) SUBCUTANEOUSLY ONCE A WEEK        OBJECTIVE:   Vital Signs: BP 136/80   Pulse 90   Ht 5' 7" (1.702 m)   Wt 198 lb 8 oz (90 kg)   LMP 07/02/2020   SpO2 99%   BMI 31.09 kg/m   Wt Readings from Last 3 Encounters:  07/11/20 198 lb 8 oz (90 kg)  06/04/19 190 lb 6.4 oz (86.4 kg)  01/29/19 195 lb 3.2 oz (88.5 kg)     Exam: General: Pt appears well and is in NAD  Lungs: Clear with good BS bilat with no rales, rhonchi, or wheezes  Heart: RRR  Abdomen: Normoactive bowel sounds, soft, nontender  Extremities: No pretibial edema. No tremor. Normal strength and motion throughout.   Neuro: MS is good with appropriate affect, pt is alert and Ox3     DM foot exam: 07/11/2020  Skin is without sores or ulcerations , patient has bilateral plantar callus formation The pedal pulses are 2+ on  right and 2+ on left. The sensation is intact on the right great toe to a screening 5.07, 10 gram monofilament        DATA REVIEWED:  Lab Results  Component Value Date   HGBA1C 7.8 (A) 07/11/2020   HGBA1C 6.8 (H) 06/04/2019   HGBA1C 5.9 (A) 01/29/2019   Results for Lisa, Maynard (MRN [id number]) as of 07/11/2020 12:50  Ref. Range 07/11/2020 10:43  Sodium Latest Ref Range: 135 - 145 mEq/L 136  Potassium Latest Ref Range: 3.5 - 5.1 mEq/L 4.1  Chloride Latest Ref Range: 96 - 112 mEq/L 104  CO2 Latest Ref Range: 19 - 32 mEq/L 25  Glucose Latest Ref Range: 70 - 99 mg/dL 143 (H)  BUN Latest Ref Range: 6 - 23 mg/dL 11  Creatinine Latest Ref Range: 0.40 - 1.20 mg/dL 0.72  Calcium Latest Ref Range: 8.4 - 10.5 mg/dL 9.0  Alkaline Phosphatase Latest Ref Range: 39 - 117 U/L 56  Albumin Latest Ref Range: 3.5 - 5.2 g/dL 4.0  AST Latest Ref Range: 0 - 37 U/L 17  ALT Latest Ref Range: 0 - 35 U/L 13  Total Protein Latest Ref Range: 6.0 - 8.3 g/dL 7.6  Total Bilirubin Latest Ref Range: 0.2 - 1.2 mg/dL 0.4  GFR Latest Ref Range: >60.00 mL/min 97.21  Total CHOL/HDL Ratio Unknown 4  Cholesterol Latest Ref Range: 0 - 200 mg/dL 189  HDL Cholesterol Latest Ref Range: >39.00 mg/dL 45.20  LDL (calc) Latest Ref Range: 0 - 99 mg/dL 125 (H)  MICROALB/CREAT RATIO Latest Ref Range: 0.0 - 30.0 mg/g 0.5  NonHDL Unknown 143.75  Triglycerides Latest Ref Range: 0.0 - 149.0 mg/dL 93.0  VLDL Latest Ref Range: 0.0 - 40.0 mg/dL 18.6  Creatinine,U Latest Units: mg/dL 150.4  Microalb, Ur Latest Ref Range: 0.0 - 1.9 mg/dL <0.7  Results for Lisa, Maynard (MRN [id number]) as of 07/14/2020 08:02  Ref. Range 07/11/2020 10:43  Prolactin Latest Units: ng/mL 40.4 (H)    MRI Brain 11/08/2018 Persistent asymmetry of the pituitary gland with suspicion of a 3-4 mm Microadenoma along the right lateral margin of the gland, possibly with mild involvement of the right cavernous sinus  ASSESSMENT / PLAN / RECOMMENDATIONS:   1)  Type 2 diabetes Mellitus, Optimally Controlled, Without complications - Most recent A1c of 7.8 %. Goal A1c <7.0 %.     -Patient with hyperglycemia and elevated A1c above goal, this is due to to not taking Trulicity for the past couple months -I will restart Trulicity at 6.12 mg weekly - We will continue metformin    MEDICATIONS:  Continue Trulicity 2.44 mg weekly.  Continue metformin 1000 mg twice a day   EDUCATION / INSTRUCTIONS:  BG monitoring instructions: Patient is instructed to check her blood sugars 1 times a day, fasting   Call [facility] if: BG persistently < 70 . I reviewed the Rule of 15 for the treatment of hypoglycemia in detail with the patient. Literature supplied.   2) Diabetic complications:   Eye: Does not have known diabetic retinopathy.   Neuro/ Feet: Does have known diabetic peripheral neuropathy .   Renal: Patient does not have known baseline CKD. She is not on an ACEI/ARB at present. MIcroalbuminuria/cr ratio are normal.    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 :  -Patient has been without cabergoline for approximately 3 months, she currently has galactorrhea  - prolactin elevated will restarted cabergoline as below    Medication   - Restart  Cabergoline 0.25 mg ( half tablet) TWICE weekly   4) Pituitary Microadenoma :   - No change in headaches  -Her last MRI was 10/2018  with mild involvement of the right cavernous sinus but otherwise stable -We will consider repeating MRI on next visit    5) Dyslipidemia:  -LDL is elevated, question compliance -Patient will be encouraged to take Lipitor as below   Medication Continue atorvastatin 10 mg daily  F/U in 4 months   Signed electronically by: Mack Guise, MD  [facility]  [facility] [place], [place] [address] Phone: [phone] FAX: [phone]   CC: Milta Deiters, MD No  address on file Phone: None  Fax: None  Return to Endocrinology clinic as below: Future Appointments  Date Time Provider [place]  07/11/2020 10:30 AM Jaelene Garciagarcia, Melanie Crazier, MD [id number] None

## 2020-07-11 ENCOUNTER — Ambulatory Visit: Payer: BC Managed Care – PPO | Admitting: Internal Medicine

## 2020-07-11 ENCOUNTER — Other Ambulatory Visit: Payer: Self-pay

## 2020-07-11 ENCOUNTER — Telehealth: Payer: Self-pay | Admitting: Internal Medicine

## 2020-07-11 ENCOUNTER — Encounter: Payer: Self-pay | Admitting: Internal Medicine

## 2020-07-11 VITALS — BP 136/80 | HR 90 | Ht 67.0 in | Wt 198.5 lb

## 2020-07-11 DIAGNOSIS — E1142 Type 2 diabetes mellitus with diabetic polyneuropathy: Secondary | ICD-10-CM

## 2020-07-11 DIAGNOSIS — E1165 Type 2 diabetes mellitus with hyperglycemia: Secondary | ICD-10-CM

## 2020-07-11 DIAGNOSIS — E119 Type 2 diabetes mellitus without complications: Secondary | ICD-10-CM

## 2020-07-11 DIAGNOSIS — D352 Benign neoplasm of pituitary gland: Secondary | ICD-10-CM | POA: Diagnosis not present

## 2020-07-11 DIAGNOSIS — E785 Hyperlipidemia, unspecified: Secondary | ICD-10-CM

## 2020-07-11 LAB — POCT GLYCOSYLATED HEMOGLOBIN (HGB A1C): Hemoglobin A1C: 7.8 % — AB (ref 4.0–5.6)

## 2020-07-11 LAB — MICROALBUMIN / CREATININE URINE RATIO
Creatinine,U: 150.4 mg/dL
Microalb Creat Ratio: 0.5 mg/g (ref 0.0–30.0)
Microalb, Ur: <0.7 mg/dL (ref 0.0–1.9)

## 2020-07-11 LAB — COMPREHENSIVE METABOLIC PANEL
ALT: 13 U/L (ref 0–35)
AST: 17 U/L (ref 0–37)
Albumin: 4 g/dL (ref 3.5–5.2)
Alkaline Phosphatase: 56 U/L (ref 39–117)
BUN: 11 mg/dL (ref 6–23)
CO2: 25 mEq/L (ref 19–32)
Calcium: 9 mg/dL (ref 8.4–10.5)
Chloride: 104 mEq/L (ref 96–112)
Creatinine, Ser: 0.72 mg/dL (ref 0.40–1.20)
GFR: 97.21 mL/min (ref >60.00)
Glucose, Bld: 143 mg/dL — ABNORMAL HIGH (ref 70–99)
Potassium: 4.1 mEq/L (ref 3.5–5.1)
Sodium: 136 mEq/L (ref 135–145)
Total Bilirubin: 0.4 mg/dL (ref 0.2–1.2)
Total Protein: 7.6 g/dL (ref 6.0–8.3)

## 2020-07-11 LAB — POCT GLUCOSE (DEVICE FOR HOME USE): POC Glucose: 163 mg/dl — AB (ref 70–99)

## 2020-07-11 LAB — LIPID PANEL
Cholesterol: 189 mg/dL (ref 0–200)
HDL: 45.2 mg/dL (ref >39.00)
LDL Cholesterol: 125 mg/dL — ABNORMAL HIGH (ref 0–99)
NonHDL: 143.75
Total CHOL/HDL Ratio: 4
Triglycerides: 93 mg/dL (ref 0.0–149.0)
VLDL: 18.6 mg/dL (ref 0.0–40.0)

## 2020-07-11 MED ORDER — ATORVASTATIN CALCIUM 10 MG PO TABS: 10.0000 mg | ORAL_TABLET | Freq: Every day | ORAL | 3 refills | Status: AC

## 2020-07-11 MED ORDER — TRULICITY 0.75 MG/0.5ML ~~LOC~~ SOAJ: 0.7500 mg | SUBCUTANEOUS | 3 refills | Status: DC

## 2020-07-11 NOTE — Telephone Encounter (Signed)
Pt called to follow up on her medication refill request from her appt today, she asks that those refills be for 3 months.

## 2020-07-11 NOTE — Patient Instructions (Addendum)
-   Continue Metformin 1000 mg twice daily  - Restart Trulicity 1.32 mg weekly      - May use Capsaicin Cream  On the feet for neuropathy symptoms     HOW TO TREAT LOW BLOOD SUGARS (Blood sugar LESS THAN 70 MG/DL)  Please follow the RULE OF 15 for the treatment of hypoglycemia treatment (when your (blood sugars are less than 70 mg/dL)    STEP 1: Take 15 grams of carbohydrates when your blood sugar is low, which includes:   3-4 GLUCOSE TABS  OR  3-4 OZ OF JUICE OR REGULAR SODA OR  ONE TUBE OF GLUCOSE GEL     STEP 2: RECHECK blood sugar in 15 MINUTES STEP 3: If your blood sugar is still low at the 15 minute recheck --> then, go back to STEP 1 and treat AGAIN with another 15 grams of carbohydrates.

## 2020-07-12 LAB — PROLACTIN: Prolactin: 40.4 ng/mL — ABNORMAL HIGH

## 2020-07-14 ENCOUNTER — Telehealth: Payer: Self-pay | Admitting: Internal Medicine

## 2020-07-14 MED ORDER — CABERGOLINE 0.5 MG PO TABS: 0.2500 mg | ORAL_TABLET | ORAL | 3 refills | Status: AC

## 2020-07-14 MED ORDER — METFORMIN HCL 1000 MG PO TABS: 1000.0000 mg | ORAL_TABLET | Freq: Two times a day (BID) | ORAL | 3 refills | Status: AC

## 2020-07-14 MED ORDER — TRULICITY 0.75 MG/0.5ML ~~LOC~~ SOAJ: 0.7500 mg | SUBCUTANEOUS | 3 refills | Status: AC

## 2020-07-14 NOTE — Telephone Encounter (Signed)
Patient have been notified. Patient asked to resent Trucility as well.

## 2020-07-14 NOTE — Telephone Encounter (Signed)
Per patient the pharmacy did not get Metformin refill.

## 2020-07-14 NOTE — Telephone Encounter (Signed)
Sent as requested.

## 2020-07-31 ENCOUNTER — Ambulatory Visit: Payer: Managed Care, Other (non HMO) | Admitting: Internal Medicine

## 2020-11-14 ENCOUNTER — Ambulatory Visit: Payer: Managed Care, Other (non HMO) | Admitting: Internal Medicine

## 2020-11-14 ENCOUNTER — Encounter: Payer: Self-pay | Admitting: Internal Medicine

## 2020-11-14 NOTE — Progress Notes (Deleted)
Name: Lisa Maynard  Age/ Sex: 50 y.o., female   MRN/ DOB: [id number], 02-14-1971     PCP: Milta Deiters, MD   Reason for Endocrinology Evaluation: Type 2 Diabetes Mellitus  Initial Endocrine Consultative Visit:  01/12/2018    PATIENT IDENTIFIER: [name] is a 50 y.o. female with a past medical history of GERD, HTN, prolactinoma,  fatty liver(on CT from November 06, 2016) and type 2 diabetes. The patient has followed with Endocrinology clinic since 01/12/2018 for consultative assistance with management of her diabetes.  DIABETIC HISTORY:  Lisa Maynard was diagnosed with T2DM in October 2018.  She has tried glipizide in the past but caused hypoglycemic episodes.  She has been on Trulicity since July 2536 and has lost 20 LBS on it, she has never been on insulin. Her hemoglobin A1c has ranged from  6.7% in 04/2016, peaking at 12.7% in 08/2017   PROLACTINOMA HISTORY: She was diagnosed with hyperprolactinemia by Dr. Dwyane Dee (Endo ) on 03/25/17. She was started on cabergoline but due to nausea the dose decreased to 0.25 mg once a week on 05/06/17. 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  MRI done in 03/2017 and again in 08/2017 showed no changes in size.  She has stable chronic headaches but no vision changes .   SUBJECTIVE:   During the last visit (07/11/2020): Her A1c 7.8 %, we continued metformin and Trulicity.  We restarted  cabergoline 0.25 mg once weekly     Today (11/14/2020): Lisa Maynard is here for a follow-up on diabetes and prolactinoma.    She checks her blood sugars 0 x a day  . The patient has not had hypoglycemic episodes since the last clinic visit.   She has been going to the Gym      She denies any headaches or vision  Denies nausea or vomiting   LMP 5/4th regular        HOME DIABETES REGIMEN:  Metformin 6440 mg BID Trulicity 3.47 mg weekly -not taking  Cabergoline 0.25 mg weekly - not taking       Statin: yes  ACE-I/ARB: no  METER  DOWNLOAD SUMMARY: did not bring     DIABETIC COMPLICATIONS: Microvascular complications:  Neuropathy Denies: retinopathy, nephropathy Last eye exam: Completed 10/2018    Macrovascular complications:  Denies: CAD, CVA, PVD   HISTORY:  Past Medical History:  Past Medical History:  Diagnosis Date   Diabetes mellitus without complication ([place])    History of chickenpox    Hyperlipidemia    Hypertension    Past Surgical History:  Past Surgical History:  Procedure Laterality Date   TUBAL LIGATION     Social History:  reports that she has been smoking cigarettes. She has a 20.00 pack-year smoking history. She has never used smokeless tobacco. No history on file for alcohol use and drug use. Family History:  Family History  Problem Relation Age of Onset   Diabetes Paternal Grandmother    Diabetes Paternal Grandfather      HOME MEDICATIONS: Allergies as of 11/14/2020   No Known Allergies      Medication List        Accurate as of November 14, 2020  7:17 AM. If you have any questions, ask your nurse or doctor.          Accu-Chek Aviva Plus w/Device Kit 1 kit by Does not apply route daily.   accu-chek multiclix lancets Use as instructed to test blood sugar once daily  DX E11.65   atorvastatin 10 MG tablet Commonly known as: LIPITOR Take 1 tablet (10 mg total) by mouth daily.   cabergoline 0.5 MG tablet Commonly known as: DOSTINEX Take 0.5 tablets (0.25 mg total) by mouth 2 (two) times a week.   ferrous sulfate 325 (65 FE) MG EC tablet Take 325 mg by mouth 2 (two) times daily with a meal.   gabapentin 300 MG capsule Commonly known as: NEURONTIN 300 mg 4 (four) times daily.   glucose blood test strip Use as instructed to test blood sugar once daily DX E11.65   Accu-Chek Aviva Plus test strip Generic drug: glucose blood Use as instructed   meloxicam 7.5 MG tablet Commonly known as: MOBIC TAKE 1 TABLET BY MOUTH TWICE DAILY   metFORMIN 1000 MG  tablet Commonly known as: GLUCOPHAGE Take 1 tablet (1,000 mg total) by mouth 2 (two) times daily with a meal.   nicotine 14 mg/24hr patch Commonly known as: NICODERM CQ - dosed in mg/24 hours Place onto the skin.   omeprazole 20 MG capsule Commonly known as: PRILOSEC Take 20 mg by mouth daily.   ondansetron 4 MG tablet Commonly known as: ZOFRAN TAKE 1 TABLET BY MOUTH ONCE DAILY AS NEEDED FOR NAUSEA   oxyCODONE 5 MG immediate release tablet Commonly known as: Oxy IR/ROXICODONE TAKE 1 TABLET BY MOUTH EVERY 6 HOURS AS NEEDED FOR BREAKTHROUGH PAIN.   pregabalin 50 MG capsule Commonly known as: LYRICA Take by mouth.   tranexamic acid 650 MG Tabs tablet Commonly known as: LYSTEDA Take 1 tablet by mouth 3 (three) times daily.   Trulicity 5.63 JS/9.7WY Sopn Generic drug: Dulaglutide Inject 0.75 mg into the skin once a week.         OBJECTIVE:   Vital Signs: There were no vitals taken for this visit.  Wt Readings from Last 3 Encounters:  07/11/20 198 lb 8 oz (90 kg)  06/04/19 190 lb 6.4 oz (86.4 kg)  01/29/19 195 lb 3.2 oz (88.5 kg)     Exam: General: Pt appears well and is in NAD  Lungs: Clear with good BS bilat with no rales, rhonchi, or wheezes  Heart: RRR  Abdomen: Normoactive bowel sounds, soft, nontender  Extremities: No pretibial edema. No tremor. Normal strength and motion throughout.   Neuro: MS is good with appropriate affect, pt is alert and Ox3     DM foot exam: 07/11/2020  Skin is without sores or ulcerations , patient has bilateral plantar callus formation The pedal pulses are 2+ on right and 2+ on left. The sensation is intact on the right great toe to a screening 5.07, 10 gram monofilament        DATA REVIEWED:  Lab Results  Component Value Date   HGBA1C 7.8 (A) 07/11/2020   HGBA1C 6.8 (H) 06/04/2019   HGBA1C 5.9 (A) 01/29/2019   Results for Lisa Maynard (MRN [id number]) as of 07/11/2020 12:50  Ref. Range 07/11/2020 10:43  Sodium Latest  Ref Range: 135 - 145 mEq/L 136  Potassium Latest Ref Range: 3.5 - 5.1 mEq/L 4.1  Chloride Latest Ref Range: 96 - 112 mEq/L 104  CO2 Latest Ref Range: 19 - 32 mEq/L 25  Glucose Latest Ref Range: 70 - 99 mg/dL 143 (H)  BUN Latest Ref Range: 6 - 23 mg/dL 11  Creatinine Latest Ref Range: 0.40 - 1.20 mg/dL 0.72  Calcium Latest Ref Range: 8.4 - 10.5 mg/dL 9.0  Alkaline Phosphatase Latest Ref Range: 39 - 117 U/L 56  Albumin  Latest Ref Range: 3.5 - 5.2 g/dL 4.0  AST Latest Ref Range: 0 - 37 U/L 17  ALT Latest Ref Range: 0 - 35 U/L 13  Total Protein Latest Ref Range: 6.0 - 8.3 g/dL 7.6  Total Bilirubin Latest Ref Range: 0.2 - 1.2 mg/dL 0.4  GFR Latest Ref Range: >60.00 mL/min 97.21  Total CHOL/HDL Ratio Unknown 4  Cholesterol Latest Ref Range: 0 - 200 mg/dL 189  HDL Cholesterol Latest Ref Range: >39.00 mg/dL 45.20  LDL (calc) Latest Ref Range: 0 - 99 mg/dL 125 (H)  MICROALB/CREAT RATIO Latest Ref Range: 0.0 - 30.0 mg/g 0.5  NonHDL Unknown 143.75  Triglycerides Latest Ref Range: 0.0 - 149.0 mg/dL 93.0  VLDL Latest Ref Range: 0.0 - 40.0 mg/dL 18.6  Creatinine,U Latest Units: mg/dL 150.4  Microalb, Ur Latest Ref Range: 0.0 - 1.9 mg/dL <0.7  Results for KASHAUNA, CELMER (MRN [id number]) as of 07/14/2020 08:02  Ref. Range 07/11/2020 10:43  Prolactin Latest Units: ng/mL 40.4 (H)    MRI Brain 11/08/2018 Persistent asymmetry of the pituitary gland with suspicion of a 3-4 mm Microadenoma along the right lateral margin of the gland, possibly with mild involvement of the right cavernous sinus  ASSESSMENT / PLAN / RECOMMENDATIONS:   1) Type 2 diabetes Mellitus, Optimally Controlled, Without complications - Most recent A1c of 7.8 %. Goal A1c <7.0 %.     -Patient with hyperglycemia and elevated A1c above goal, this is due to to not taking Trulicity for the past couple months -I will restart Trulicity at 1.93 mg weekly - We will continue metformin    MEDICATIONS: Continue Trulicity 7.90 mg  weekly. Continue metformin 1000 mg twice a day    EDUCATION / INSTRUCTIONS: BG monitoring instructions: Patient is instructed to check her blood sugars 1 times a day, fasting  Call [facility] if: BG persistently < 70 I reviewed the Rule of 15 for the treatment of hypoglycemia in detail with the patient. Literature supplied.   2) Diabetic complications:  Eye: Does not have known diabetic retinopathy.  Neuro/ Feet: Does have known diabetic peripheral neuropathy .  Renal: Patient does not have known baseline CKD. She is not on an ACEI/ARB at present. MIcroalbuminuria/cr ratio are normal.    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 :  -Patient has been without cabergoline for approximately 3 months, she currently has galactorrhea  - prolactin elevated will restarted cabergoline as below    Medication   - Restart  Cabergoline 0.25 mg ( half tablet) TWICE weekly   4) Pituitary Microadenoma :   - No change in headaches  -Her last MRI was 10/2018  with mild involvement of the right cavernous sinus but otherwise stable -We will consider repeating MRI on next visit    5) Dyslipidemia:  -LDL is elevated, question compliance -Patient will be encouraged to take Lipitor as below   Medication Continue atorvastatin 10 mg daily  F/U in 4 months   Signed electronically by: Mack Guise, MD  [facility]  [facility] [address], [place] [address] Phone: [phone] FAX: [phone]   CC: Milta Deiters, MD No address on file Phone: None  Fax: None  Return to Endocrinology clinic as below: Future Appointments  Date Time Provider [place]  11/14/2020  8:30 AM Hasten Sweitzer, Melanie Crazier, MD [facility] None

## 2021-06-29 ENCOUNTER — Ambulatory Visit: Payer: Managed Care, Other (non HMO) | Admitting: Internal Medicine

## 2021-06-29 ENCOUNTER — Encounter: Payer: Self-pay | Admitting: Internal Medicine

## 2021-06-29 NOTE — Progress Notes (Deleted)
Name: Lisa Maynard  Age/ Sex: 51 y.o., female   MRN/ DOB: [id number], January 07, 1971     PCP: Milta Deiters, MD   Reason for Endocrinology Evaluation: Type 2 Diabetes Mellitus  Initial Endocrine Consultative Visit:  01/12/2018    PATIENT IDENTIFIER: Lisa Maynard is a 51 y.o. female with a past medical history of GERD, HTN, prolactinoma,  fatty liver(on CT from November 06, 2016) and type 2 diabetes. The patient has followed with Endocrinology clinic since 01/12/2018 for consultative assistance with management of her diabetes.  DIABETIC HISTORY:  Lisa Maynard was diagnosed with T2DM in October 2018.  She has tried glipizide in the past but caused hypoglycemic episodes.  She has been on Trulicity since July 3235 and has lost 20 LBS on it, she has never been on insulin. Her hemoglobin A1c has ranged from  6.7% in 04/2016, peaking at 12.7% in 08/2017   PROLACTINOMA HISTORY: She was diagnosed with hyperprolactinemia by Dr. Dwyane Dee (Endo ) on 03/25/17. She was started on cabergoline but due to nausea the dose decreased to 0.25 mg once a week on 05/06/17. 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  MRI done in 03/2017 and again in 08/2017 showed no changes in size.  She has stable chronic headaches but no vision changes .   SUBJECTIVE:   During the last visit (07/11/2020): Her A1c was 7.8%, we continued metformin and Trulicity.  We continued cabergoline 0.25 mg once weekly     Today (06/29/2021): Lisa Maynard is here for a follow-up on diabetes and prolactinoma.  She has not been here in 13 months.She has been in [place] visiting her son and just returned recently.   She has not been checking her glucose  She has been out of Trulicity for ~ 2 months Has been out of cabergoline for ~ 3 months    She checks her blood sugars 0 x a day  . The patient has not had hypoglycemic episodes since the last clinic visit.   She has been going to the Gym   Galactorrhea has returned    She  denies any headaches or vision  Denies nausea or vomiting   LMP 5/4th regular  She is having worsening neuropathic symptoms       HOME DIABETES REGIMEN:  Metformin 5732 mg BID Trulicity 2.02 mg weekly -not taking  Cabergoline 0.25 mg weekly - not taking       Statin: yes  ACE-I/ARB: no  METER DOWNLOAD SUMMARY: did not bring     DIABETIC COMPLICATIONS: Microvascular complications:  Neuropathy Denies: retinopathy, nephropathy Last eye exam: Completed 10/2018    Macrovascular complications:  Denies: CAD, CVA, PVD   HISTORY:  Past Medical History:  Past Medical History:  Diagnosis Date   Diabetes mellitus without complication ([place])    History of chickenpox    Hyperlipidemia    Hypertension    Past Surgical History:  Past Surgical History:  Procedure Laterality Date   TUBAL LIGATION     Social History:  reports that she has been smoking cigarettes. She has a 20.00 pack-year smoking history. She has never used smokeless tobacco. No history on file for alcohol use and drug use. Family History:  Family History  Problem Relation Age of Onset   Diabetes Paternal Grandmother    Diabetes Paternal Grandfather      HOME MEDICATIONS: Allergies as of 06/29/2021   No Known Allergies      Medication List  Accurate as of Jun 29, 2021  7:24 AM. If you have any questions, ask your nurse or doctor.          Accu-Chek Aviva Plus w/Device Kit 1 kit by Does not apply route daily.   accu-chek multiclix lancets Use as instructed to test blood sugar once daily DX E11.65   atorvastatin 10 MG tablet Commonly known as: LIPITOR Take 1 tablet (10 mg total) by mouth daily.   cabergoline 0.5 MG tablet Commonly known as: DOSTINEX Take 0.5 tablets (0.25 mg total) by mouth 2 (two) times a week.   ferrous sulfate 325 (65 FE) MG EC tablet Take 325 mg by mouth 2 (two) times daily with a meal.   gabapentin 300 MG capsule Commonly known as: NEURONTIN 300 mg 4  (four) times daily.   glucose blood test strip Use as instructed to test blood sugar once daily DX E11.65   Accu-Chek Aviva Plus test strip Generic drug: glucose blood Use as instructed   meloxicam 7.5 MG tablet Commonly known as: MOBIC TAKE 1 TABLET BY MOUTH TWICE DAILY   metFORMIN 1000 MG tablet Commonly known as: GLUCOPHAGE Take 1 tablet (1,000 mg total) by mouth 2 (two) times daily with a meal.   nicotine 14 mg/24hr patch Commonly known as: NICODERM CQ - dosed in mg/24 hours Place onto the skin.   omeprazole 20 MG capsule Commonly known as: PRILOSEC Take 20 mg by mouth daily.   ondansetron 4 MG tablet Commonly known as: ZOFRAN TAKE 1 TABLET BY MOUTH ONCE DAILY AS NEEDED FOR NAUSEA   oxyCODONE 5 MG immediate release tablet Commonly known as: Oxy IR/ROXICODONE TAKE 1 TABLET BY MOUTH EVERY 6 HOURS AS NEEDED FOR BREAKTHROUGH PAIN.   pregabalin 50 MG capsule Commonly known as: LYRICA Take by mouth.   tranexamic acid 650 MG Tabs tablet Commonly known as: LYSTEDA Take 1 tablet by mouth 3 (three) times daily.   Trulicity 3.54 TG/2.5WL Sopn Generic drug: Dulaglutide Inject 0.75 mg into the skin once a week.         OBJECTIVE:   Vital Signs: There were no vitals taken for this visit.  Wt Readings from Last 3 Encounters:  07/11/20 198 lb 8 oz (90 kg)  06/04/19 190 lb 6.4 oz (86.4 kg)  01/29/19 195 lb 3.2 oz (88.5 kg)     Exam: General: Pt appears well and is in NAD  Lungs: Clear with good BS bilat with no rales, rhonchi, or wheezes  Heart: RRR  Abdomen: Normoactive bowel sounds, soft, nontender  Extremities: No pretibial edema. No tremor. Normal strength and motion throughout.   Neuro: MS is good with appropriate affect, pt is alert and Ox3     DM foot exam: 07/11/2020  Skin is without sores or ulcerations , patient has bilateral plantar callus formation The pedal pulses are 2+ on right and 2+ on left. The sensation is intact on the right great toe to  a screening 5.07, 10 gram monofilament        DATA REVIEWED:  Lab Results  Component Value Date   HGBA1C 7.8 (A) 07/11/2020   HGBA1C 6.8 (H) 06/04/2019   HGBA1C 5.9 (A) 01/29/2019   Results for DELAINE, HERNANDEZ (MRN [id number]) as of 07/11/2020 12:50  Ref. Range 07/11/2020 10:43  Sodium Latest Ref Range: 135 - 145 mEq/L 136  Potassium Latest Ref Range: 3.5 - 5.1 mEq/L 4.1  Chloride Latest Ref Range: 96 - 112 mEq/L 104  CO2 Latest Ref Range: 19 - 32 mEq/L  25  Glucose Latest Ref Range: 70 - 99 mg/dL 143 (H)  BUN Latest Ref Range: 6 - 23 mg/dL 11  Creatinine Latest Ref Range: 0.40 - 1.20 mg/dL 0.72  Calcium Latest Ref Range: 8.4 - 10.5 mg/dL 9.0  Alkaline Phosphatase Latest Ref Range: 39 - 117 U/L 56  Albumin Latest Ref Range: 3.5 - 5.2 g/dL 4.0  AST Latest Ref Range: 0 - 37 U/L 17  ALT Latest Ref Range: 0 - 35 U/L 13  Total Protein Latest Ref Range: 6.0 - 8.3 g/dL 7.6  Total Bilirubin Latest Ref Range: 0.2 - 1.2 mg/dL 0.4  GFR Latest Ref Range: >60.00 mL/min 97.21  Total CHOL/HDL Ratio Unknown 4  Cholesterol Latest Ref Range: 0 - 200 mg/dL 189  HDL Cholesterol Latest Ref Range: >39.00 mg/dL 45.20  LDL (calc) Latest Ref Range: 0 - 99 mg/dL 125 (H)  MICROALB/CREAT RATIO Latest Ref Range: 0.0 - 30.0 mg/g 0.5  NonHDL Unknown 143.75  Triglycerides Latest Ref Range: 0.0 - 149.0 mg/dL 93.0  VLDL Latest Ref Range: 0.0 - 40.0 mg/dL 18.6  Creatinine,U Latest Units: mg/dL 150.4  Microalb, Ur Latest Ref Range: 0.0 - 1.9 mg/dL <0.7  Results for DEANIE, JUPITER (MRN [id number]) as of 07/14/2020 08:02  Ref. Range 07/11/2020 10:43  Prolactin Latest Units: ng/mL 40.4 (H)    MRI Brain 11/08/2018 Persistent asymmetry of the pituitary gland with suspicion of a 3-4 mm Microadenoma along the right lateral margin of the gland, possibly with mild involvement of the right cavernous sinus  ASSESSMENT / PLAN / RECOMMENDATIONS:   1) Type 2 diabetes Mellitus, Optimally Controlled, Without complications -  Most recent A1c of 7.8 %. Goal A1c <7.0 %.     -Patient with hyperglycemia and elevated A1c above goal, this is due to to not taking Trulicity for the past couple months -I will restart Trulicity at 4.09 mg weekly - We will continue metformin    MEDICATIONS: Continue Trulicity 8.11 mg weekly. Continue metformin 1000 mg twice a day    EDUCATION / INSTRUCTIONS: BG monitoring instructions: Patient is instructed to check her blood sugars 1 times a day, fasting  Call [place] [facility] if: BG persistently < 70 I reviewed the Rule of 15 for the treatment of hypoglycemia in detail with the patient. Literature supplied.   2) Diabetic complications:  Eye: Does not have known diabetic retinopathy.  Neuro/ Feet: Does have known diabetic peripheral neuropathy .  Renal: Patient does not have known baseline CKD. She is not on an ACEI/ARB at present. MIcroalbuminuria/cr ratio are normal.    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 :  -Patient has been without cabergoline for approximately 3 months, she currently has galactorrhea  - prolactin elevated will restarted cabergoline as below    Medication   - Restart  Cabergoline 0.25 mg ( half tablet) TWICE weekly   4) Pituitary Microadenoma :   - No change in headaches  -Her last MRI was 10/2018  with mild involvement of the right cavernous sinus but otherwise stable -We will consider repeating MRI on next visit    5) Dyslipidemia:  -LDL is elevated, question compliance -Patient will be encouraged to take Lipitor as below   Medication Continue atorvastatin 10 mg daily  F/U in 4 months   Signed electronically by: Mack Guise, MD  [facility]  [facility] [address] [place], [place] [address] Phone: [phone] FAX: [phone]   CC: Milta Deiters, MD No address on  file Phone: None  Fax: None  Return to Endocrinology clinic as below: Future  Appointments  Date Time Provider [place]  06/29/2021  8:50 AM Kemar Pandit, Melanie Crazier, MD [facility] None

## 2021-07-01 ENCOUNTER — Telehealth: Payer: Self-pay | Admitting: Internal Medicine

## 2021-07-01 NOTE — Telephone Encounter (Signed)
Patient dismissed from [facility] and all providers practicing at this clinic 07/01/21  ?
